# Patient Record
Sex: Female | Born: 1960 | Race: White | Hispanic: No | Marital: Married | Smoking: Never smoker
Health system: Southern US, Community
[De-identification: ages and names within clinical notes are randomized; demographics above are authoritative.]

## PROBLEM LIST (undated history)

## (undated) DIAGNOSIS — I10 Essential (primary) hypertension: Secondary | ICD-10-CM

## (undated) DIAGNOSIS — G4700 Insomnia, unspecified: Secondary | ICD-10-CM

## (undated) DIAGNOSIS — K219 Gastro-esophageal reflux disease without esophagitis: Secondary | ICD-10-CM

## (undated) HISTORY — DX: Gastro-esophageal reflux disease without esophagitis: K21.9

## (undated) HISTORY — PX: BREAST ENHANCEMENT SURGERY: SHX7

## (undated) HISTORY — PX: ESOPHAGOGASTRODUODENOSCOPY: SHX1529

## (undated) HISTORY — PX: COLONOSCOPY: SHX174

## (undated) HISTORY — DX: Essential (primary) hypertension: I10

## (undated) HISTORY — PX: ABDOMINAL HYSTERECTOMY: SHX81

## (undated) HISTORY — DX: Insomnia, unspecified: G47.00

---

## 2004-04-25 ENCOUNTER — Ambulatory Visit: Payer: Self-pay | Admitting: Internal Medicine

## 2005-04-02 ENCOUNTER — Ambulatory Visit: Payer: Self-pay | Admitting: Orthopedic Surgery

## 2005-04-04 ENCOUNTER — Ambulatory Visit (HOSPITAL_COMMUNITY): Admission: RE | Admit: 2005-04-04 | Discharge: 2005-04-04 | Payer: Self-pay | Admitting: Gynecology

## 2005-04-14 ENCOUNTER — Ambulatory Visit: Payer: Self-pay | Admitting: Orthopedic Surgery

## 2006-11-16 ENCOUNTER — Ambulatory Visit: Payer: Self-pay | Admitting: Unknown Physician Specialty

## 2006-11-19 ENCOUNTER — Ambulatory Visit: Payer: Self-pay | Admitting: Unknown Physician Specialty

## 2007-01-28 ENCOUNTER — Inpatient Hospital Stay: Payer: Self-pay | Admitting: Unknown Physician Specialty

## 2007-06-01 ENCOUNTER — Ambulatory Visit: Payer: Self-pay | Admitting: Unknown Physician Specialty

## 2007-08-30 ENCOUNTER — Emergency Department (HOSPITAL_COMMUNITY): Admission: EM | Admit: 2007-08-30 | Discharge: 2007-08-30 | Payer: Self-pay | Admitting: Emergency Medicine

## 2008-06-13 ENCOUNTER — Ambulatory Visit: Payer: Self-pay | Admitting: Unknown Physician Specialty

## 2009-08-15 ENCOUNTER — Ambulatory Visit: Payer: Self-pay | Admitting: Unknown Physician Specialty

## 2009-11-16 ENCOUNTER — Ambulatory Visit: Payer: Self-pay | Admitting: Rheumatology

## 2011-02-21 ENCOUNTER — Ambulatory Visit: Payer: Self-pay | Admitting: Internal Medicine

## 2014-02-10 ENCOUNTER — Ambulatory Visit: Payer: Self-pay | Admitting: Gastroenterology

## 2014-02-24 ENCOUNTER — Ambulatory Visit: Payer: Self-pay | Admitting: Gastroenterology

## 2014-05-26 ENCOUNTER — Ambulatory Visit: Payer: Self-pay | Admitting: Gastroenterology

## 2014-07-01 ENCOUNTER — Encounter (HOSPITAL_COMMUNITY): Payer: Self-pay

## 2014-07-01 ENCOUNTER — Emergency Department (HOSPITAL_COMMUNITY): Payer: BC Managed Care – PPO

## 2014-07-01 ENCOUNTER — Emergency Department (HOSPITAL_COMMUNITY)
Admission: EM | Admit: 2014-07-01 | Discharge: 2014-07-01 | Disposition: A | Discharge status: Home / Self Care | Payer: BC Managed Care – PPO | Attending: Emergency Medicine | Admitting: Emergency Medicine

## 2014-07-01 DIAGNOSIS — Z90710 Acquired absence of both cervix and uterus: Secondary | ICD-10-CM | POA: Diagnosis not present

## 2014-07-01 DIAGNOSIS — R52 Pain, unspecified: Secondary | ICD-10-CM

## 2014-07-01 DIAGNOSIS — Z79899 Other long term (current) drug therapy: Secondary | ICD-10-CM | POA: Diagnosis not present

## 2014-07-01 DIAGNOSIS — R1031 Right lower quadrant pain: Secondary | ICD-10-CM | POA: Insufficient documentation

## 2014-07-01 LAB — CBC WITH DIFFERENTIAL/PLATELET
Basophils Absolute: 0 10*3/uL (ref 0.0–0.1)
Basophils Relative: 0 % (ref 0–1)
Eosinophils Absolute: 0.1 10*3/uL (ref 0.0–0.7)
Eosinophils Relative: 1 % (ref 0–5)
HCT: 41.9 % (ref 36.0–46.0)
Hemoglobin: 13.8 g/dL (ref 12.0–15.0)
Lymphocytes Relative: 30 % (ref 12–46)
Lymphs Abs: 2.5 10*3/uL (ref 0.7–4.0)
MCH: 28.9 pg (ref 26.0–34.0)
MCHC: 32.9 g/dL (ref 30.0–36.0)
MCV: 87.7 fL (ref 78.0–100.0)
Monocytes Absolute: 0.4 10*3/uL (ref 0.1–1.0)
Monocytes Relative: 5 % (ref 3–12)
NEUTROS PCT: 64 % (ref 43–77)
Neutro Abs: 5.2 10*3/uL (ref 1.7–7.7)
Platelets: 331 10*3/uL (ref 150–400)
RBC: 4.78 MIL/uL (ref 3.87–5.11)
RDW: 12.2 % (ref 11.5–15.5)
WBC: 8.2 10*3/uL (ref 4.0–10.5)

## 2014-07-01 LAB — HEPATIC FUNCTION PANEL
ALBUMIN: 4.3 g/dL (ref 3.5–5.2)
ALT: 58 U/L — ABNORMAL HIGH (ref 0–35)
AST: 44 U/L — ABNORMAL HIGH (ref 0–37)
Alkaline Phosphatase: 81 U/L (ref 39–117)
BILIRUBIN TOTAL: 0.3 mg/dL (ref 0.3–1.2)
Bilirubin, Direct: <0.1 mg/dL (ref 0.0–0.5)
Total Protein: 8.3 g/dL (ref 6.0–8.3)

## 2014-07-01 LAB — BASIC METABOLIC PANEL
Anion gap: 5 (ref 5–15)
BUN: 12 mg/dL (ref 6–23)
CALCIUM: 9.7 mg/dL (ref 8.4–10.5)
CO2: 31 mmol/L (ref 19–32)
Chloride: 104 mmol/L (ref 96–112)
Creatinine, Ser: 0.72 mg/dL (ref 0.50–1.10)
GFR calc Af Amer: >90 mL/min (ref >90)
GFR calc non Af Amer: >90 mL/min (ref >90)
Glucose, Bld: 87 mg/dL (ref 70–99)
Potassium: 3.7 mmol/L (ref 3.5–5.1)
Sodium: 140 mmol/L (ref 135–145)

## 2014-07-01 LAB — URINALYSIS, ROUTINE W REFLEX MICROSCOPIC
Bilirubin Urine: NEGATIVE
Glucose, UA: NEGATIVE mg/dL
Hgb urine dipstick: NEGATIVE
Ketones, ur: NEGATIVE mg/dL
Leukocytes, UA: NEGATIVE
Nitrite: NEGATIVE
Protein, ur: NEGATIVE mg/dL
Specific Gravity, Urine: 1.02 (ref 1.005–1.030)
Urobilinogen, UA: 0.2 mg/dL (ref 0.0–1.0)
pH: 6 (ref 5.0–8.0)

## 2014-07-01 MED ORDER — IOHEXOL 300 MG/ML  SOLN
50.0000 mL | Freq: Once | INTRAMUSCULAR | Status: AC | PRN
  Administered 2014-07-01: 50 mL via ORAL

## 2014-07-01 MED ORDER — IOHEXOL 300 MG/ML  SOLN
100.0000 mL | Freq: Once | INTRAMUSCULAR | Status: AC | PRN
  Administered 2014-07-01: 100 mL via INTRAVENOUS

## 2014-07-01 MED ORDER — OXYCODONE-ACETAMINOPHEN 5-325 MG PO TABS: 1.0000 | ORAL_TABLET | ORAL | Status: DC | PRN

## 2014-07-01 MED ORDER — ONDANSETRON HCL 4 MG PO TABS: 4.0000 mg | ORAL_TABLET | Freq: Four times a day (QID) | ORAL | Status: DC

## 2014-07-01 MED ORDER — DICYCLOMINE HCL 20 MG PO TABS: 20.0000 mg | ORAL_TABLET | Freq: Two times a day (BID) | ORAL | Status: DC

## 2014-07-01 MED ORDER — ONDANSETRON HCL 4 MG/2ML IJ SOLN
4.0000 mg | Freq: Once | INTRAMUSCULAR | Status: AC
  Administered 2014-07-01: 4 mg via INTRAVENOUS
  Filled 2014-07-01: qty 2

## 2014-07-01 MED ORDER — HYDROMORPHONE HCL 1 MG/ML IJ SOLN
1.0000 mg | Freq: Once | INTRAMUSCULAR | Status: AC
  Administered 2014-07-01: 1 mg via INTRAVENOUS
  Filled 2014-07-01: qty 1

## 2014-07-01 NOTE — ED Provider Notes (Signed)
CSN: [id number]     Arrival date & time 07/01/14  1336 History   First MD Initiated Contact with Patient 07/01/14 1408     Chief Complaint  Patient presents with  . Abdominal Pain     (Consider location/radiation/quality/duration/timing/severity/associated sxs/prior Treatment) Patient is a 54 y.o. female presenting with abdominal pain. The history is provided by the patient (the pt complains of rlq pain for 2 weeks.  worse in 2 days).  Abdominal Pain Pain location:  RLQ Pain quality: aching   Pain radiates to:  Does not radiate Pain severity:  Moderate Onset quality:  Sudden Timing:  Constant Progression:  Waxing and waning Chronicity:  New Context: not alcohol use   Associated symptoms: no chest pain, no cough, no diarrhea, no fatigue and no hematuria     History reviewed. No pertinent past medical history. Past Surgical History  Procedure Laterality Date  . Abdominal hysterectomy    . Esophagogastroduodenoscopy    . Colonoscopy     No family history on file. History  Substance Use Topics  . Smoking status: Never Smoker   . Smokeless tobacco: Not on file  . Alcohol Use: No   OB History    No data available     Review of Systems  Constitutional: Negative for appetite change and fatigue.  HENT: Negative for congestion, ear discharge and sinus pressure.   Eyes: Negative for discharge.  Respiratory: Negative for cough.   Cardiovascular: Negative for chest pain.  Gastrointestinal: Positive for abdominal pain. Negative for diarrhea.  Genitourinary: Negative for frequency and hematuria.  Musculoskeletal: Negative for back pain.  Skin: Negative for rash.  Neurological: Negative for seizures and headaches.  Psychiatric/Behavioral: Negative for hallucinations.      Allergies  Tetracyclines & related  Home Medications   Prior to Admission medications   Medication Sig Start Date End Date Taking? Authorizing Provider  fluticasone (FLONASE) 50 MCG/ACT nasal spray  Place 1 spray into both nostrils daily as needed for allergies.  05/10/14  Yes Historical Provider, MD  ibuprofen (ADVIL,MOTRIN) 200 MG tablet Take 600 mg by mouth daily as needed for moderate pain.   Yes Historical Provider, MD  pantoprazole (PROTONIX) 20 MG tablet Take 20 mg by mouth 2 (two) times daily.   Yes Historical Provider, MD  traMADol (ULTRAM) 50 MG tablet Take 1 tablet by mouth daily as needed for moderate pain.  06/03/14  Yes Historical Provider, MD  Vitamin D, Ergocalciferol, (DRISDOL) 50000 UNITS CAPS capsule Take 1 capsule by mouth once a week. Take on Sunday. 05/23/14  Yes Historical Provider, MD  zolpidem (AMBIEN) 5 MG tablet Take 2.5 mg by mouth at bedtime as needed for sleep.   Yes Historical Provider, MD   BP 154/105 mmHg  Pulse 79  Temp(Src) 98.5 F (36.9 C) (Oral)  Resp 20  Ht  (1.6 m)  Wt 165 lb (74.844 kg)  BMI 29.24 kg/m2  SpO2 100% Physical Exam  Constitutional: She is oriented to person, place, and time. She appears well-developed.  HENT:  Head: Normocephalic.  Eyes: Conjunctivae and EOM are normal. No scleral icterus.  Neck: Neck supple. No thyromegaly present.  Cardiovascular: Normal rate and regular rhythm.  Exam reveals no gallop and no friction rub.   No murmur heard. Pulmonary/Chest: No stridor. She has no wheezes. She has no rales. She exhibits no tenderness.  Abdominal: She exhibits no distension. There is tenderness. There is no rebound.  Tender rlq  Musculoskeletal: Normal range of motion. She exhibits no  edema.  Lymphadenopathy:    She has no cervical adenopathy.  Neurological: She is oriented to person, place, and time. She exhibits normal muscle tone. Coordination normal.  Skin: No rash noted. No erythema.  Psychiatric: She has a normal mood and affect. Her behavior is normal.    ED Course  Procedures (including critical care time) Labs Review Labs Reviewed  URINALYSIS, ROUTINE W REFLEX MICROSCOPIC - Abnormal; Notable for the  following:    APPearance HAZY (*)    All other components within normal limits  HEPATIC FUNCTION PANEL - Abnormal; Notable for the following:    AST 44 (*)    ALT 58 (*)    All other components within normal limits  CBC WITH DIFFERENTIAL/PLATELET  BASIC METABOLIC PANEL    Imaging Review No results found.   EKG Interpretation None      MDM   Final diagnoses:  Pain        Benny LennertJoseph L Izayah Miner, MD 07/03/14 2358

## 2014-07-01 NOTE — ED Notes (Signed)
Pt c/o pain in RLQ x 2 weeks, worse the past 2 days.  Denies any vomiting, diarrhea, or difficulty urinating.  Denies fever.  Denies abnormal vaginal bleeding or discharge.  LBM was today.

## 2014-07-01 NOTE — Discharge Instructions (Signed)

## 2014-07-01 NOTE — ED Provider Notes (Signed)
Patient signed out to me to follow-up on CT scan. Patient has been experiencing intermittent episodes of right lower quadrant and inguinal area pain for 2 weeks. In the last 2 days, pain has become more continuous, now constant. It has become severe as well. She has not had any other concurrent symptoms. Patient has had total hysterectomy.  CT scan has been reviewed, no cause is identified. She has a normal appendix. No evidence of inflammatory process, diverticulitis, colitis, etc. Incidental findings noted, of pain identified, will treat with analgesia and is to follow-[place].  Results for orders placed or performed during the hospital encounter of 07/01/14  CBC with Differential  Result Value Ref Range   WBC 8.2 4.0 - 10.5 K/uL   RBC 4.78 3.87 - 5.11 MIL/uL   Hemoglobin 13.8 12.0 - 15.0 g/dL   HCT 16.141.9 09.636.0 - 04.546.0 %   MCV 87.7 78.0 - 100.0 fL   MCH 28.9 26.0 - 34.0 pg   MCHC 32.9 30.0 - 36.0 g/dL   RDW 40.912.2 81.111.5 - 91.415.5 %   Platelets 331 150 - 400 K/uL   Neutrophils Relative % 64 43 - 77 %   Neutro Abs 5.2 1.7 - 7.7 K/uL   Lymphocytes Relative 30 12 - 46 %   Lymphs Abs 2.5 0.7 - 4.0 K/uL   Monocytes Relative 5 3 - 12 %   Monocytes Absolute 0.4 0.1 - 1.0 K/uL   Eosinophils Relative 1 0 - 5 %   Eosinophils Absolute 0.1 0.0 - 0.7 K/uL   Basophils Relative 0 0 - 1 %   Basophils Absolute 0.0 0.0 - 0.1 K/uL  Basic metabolic panel  Result Value Ref Range   Sodium 140 135 - 145 mmol/L   Potassium 3.7 3.5 - 5.1 mmol/L   Chloride 104 96 - 112 mmol/L   CO2 31 19 - 32 mmol/L   Glucose, Bld 87 70 - 99 mg/dL   BUN 12 6 - 23 mg/dL   Creatinine, Ser 7.820.72 0.50 - 1.10 mg/dL   Calcium 9.7 8.4 - 95.610.5 mg/dL   GFR calc non Af Amer >90 >90 mL/min   GFR calc Af Amer >90 >90 mL/min   Anion gap 5 5 - 15  Urinalysis, Routine w reflex microscopic  Result Value Ref Range   Color, Urine YELLOW YELLOW   APPearance HAZY (A) CLEAR   Specific Gravity, Urine 1.020  1.005 - 1.030   pH 6.0 5.0 - 8.0   Glucose, UA NEGATIVE NEGATIVE mg/dL   Hgb urine dipstick NEGATIVE NEGATIVE   Bilirubin Urine NEGATIVE NEGATIVE   Ketones, ur NEGATIVE NEGATIVE mg/dL   Protein, ur NEGATIVE NEGATIVE mg/dL   Urobilinogen, UA 0.2 0.0 - 1.0 mg/dL   Nitrite NEGATIVE NEGATIVE   Leukocytes, UA NEGATIVE NEGATIVE  Hepatic function panel  Result Value Ref Range   Total Protein 8.3 6.0 - 8.3 g/dL   Albumin 4.3 3.5 - 5.2 g/dL   AST 44 (H) 0 - 37 U/L   ALT 58 (H) 0 - 35 U/L   Alkaline Phosphatase 81 39 - 117 U/L   Total Bilirubin 0.3 0.3 - 1.2 mg/dL   Bilirubin, Direct <2.1<0.1 0.0 - 0.5 mg/dL   Indirect Bilirubin NOT CALCULATED 0.3 - 0.9 mg/dL   Ct Abdomen Pelvis W Contrast  07/01/2014   CLINICAL DATA:  Pt c/o pain in RLQ x 2 weeks, worse the past 2 days. Denies any vomiting, diarrhea, or difficulty urinating. Denies fever. Hx of  hysterectomy, EGD  EXAM: CT ABDOMEN AND PELVIS WITH CONTRAST  TECHNIQUE: Multidetector CT imaging of the abdomen and pelvis was performed using the standard protocol following bolus administration of intravenous contrast.  CONTRAST:  OMNIPAQUE IOHEXOL 300 MG/ML SOLN, 50mL OMNIPAQUE IOHEXOL 300 MG/ML SOLN  COMPARISON:  None  FINDINGS: Clear lung bases.  Heart normal in size.  Fatty infiltration of the liver. There multiple low-density liver lesions. Largest lies in the posterior right lobe measuring 2.4 cm. These are nonenhancing consistent with cysts. There is more focal hypoattenuation in the central liver consistent with focal fat. No other liver lesions.  Spleen and pancreas are normal. Gallbladder surgically absent. No bile duct dilation. Normal adrenal glands, kidneys, ureters and bladder.  No pathologically enlarged lymph nodes. No abnormal fluid collections.  Uterus surgically absent.  No pelvic masses.  Colon and small bowel are unremarkable.  Normal appendix.  No significant bony abnormality.  IMPRESSION: 1. No acute findings.  Normal appendix  visualized. 2. Diffuse hepatic steatosis. 3. Multiple small low-density liver lesions, likely all cysts. 4. Status post cholecystectomy and hysterectomy.   Electronically Signed   By: Amie Portland M.D.   On: 07/01/2014 16:36      Gilda Crease, MD 07/01/14 [phone]

## 2014-07-12 ENCOUNTER — Ambulatory Visit: Payer: Self-pay | Admitting: Internal Medicine

## 2014-10-02 LAB — SURGICAL PATHOLOGY

## 2016-04-29 ENCOUNTER — Other Ambulatory Visit: Payer: Self-pay | Admitting: Obstetrics and Gynecology

## 2016-04-29 DIAGNOSIS — Z1231 Encounter for screening mammogram for malignant neoplasm of breast: Secondary | ICD-10-CM

## 2016-05-31 IMAGING — CT CT ABD-PELV W/ CM
2 of 5 series · 16 of 46 positions shown, 18 images · IV contrast (omnipaque)
Comparison: None

CLINICAL DATA: Pt c/o pain in RLQ x 2 weeks, worse the past 2 days.
Denies any vomiting, diarrhea, or difficulty urinating. Denies
fever. Hx of hysterectomy, EGD

EXAM:
CT ABDOMEN AND PELVIS WITH CONTRAST
TECHNIQUE: Multidetector CT imaging of the abdomen and pelvis was performed
using the standard protocol following bolus administration of
intravenous contrast.
CONTRAST:  100mL OMNIPAQUE IOHEXOL 300 MG/ML SOLN, 50mL OMNIPAQUE
IOHEXOL 300 MG/ML SOLN

[Series 2: abd_pel_with 5.0 b40f · axial · 0.73mm/px · z∈[-489,-74]mm · 13 of 95 slices shown, 15 images]
[im 6/95  soft-tissue]
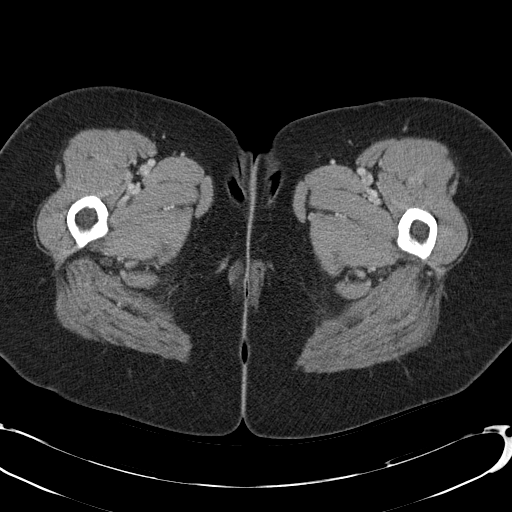
[im 6/95  bone]
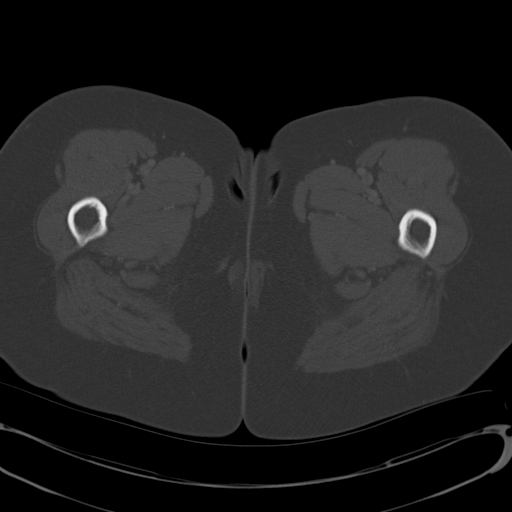
[im 11/95  soft-tissue]
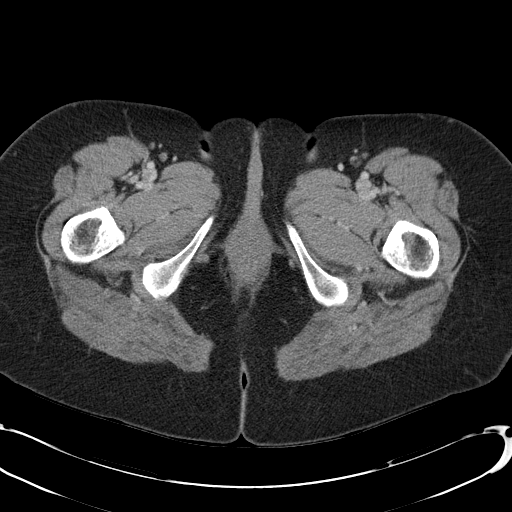
[im 21/95  soft-tissue]
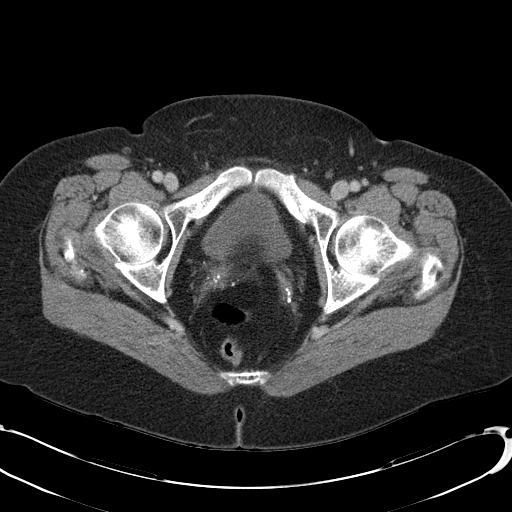
[im 27/95  soft-tissue]
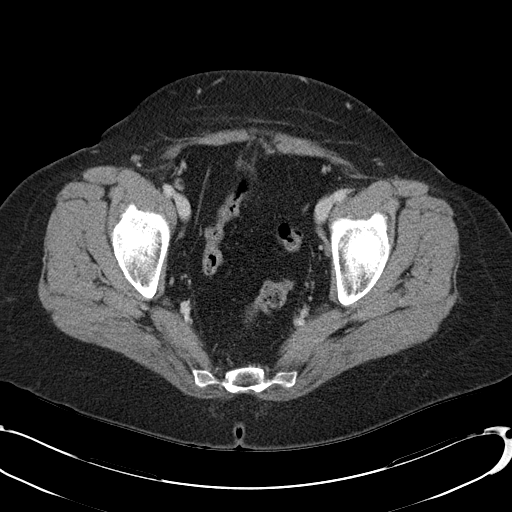
[im 32/95  soft-tissue]
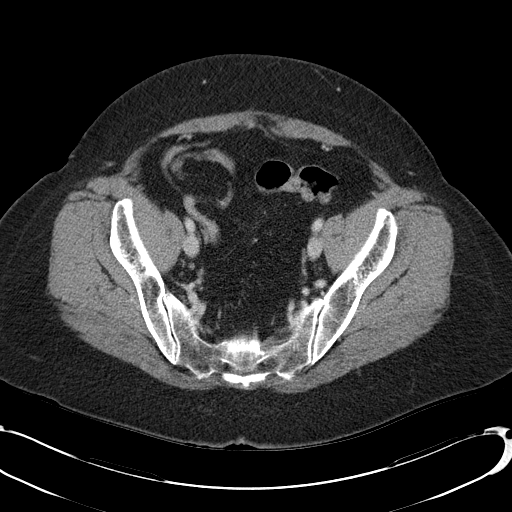
[im 42/95  soft-tissue]
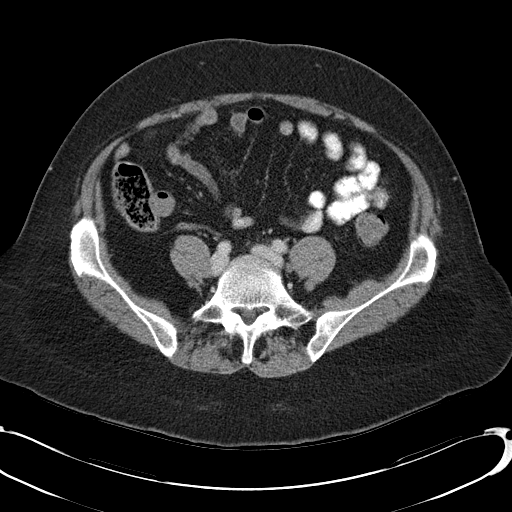
[im 48/95  soft-tissue]
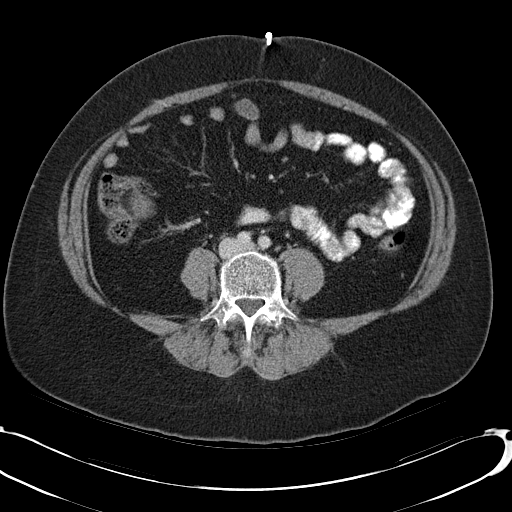
[im 53/95  soft-tissue]
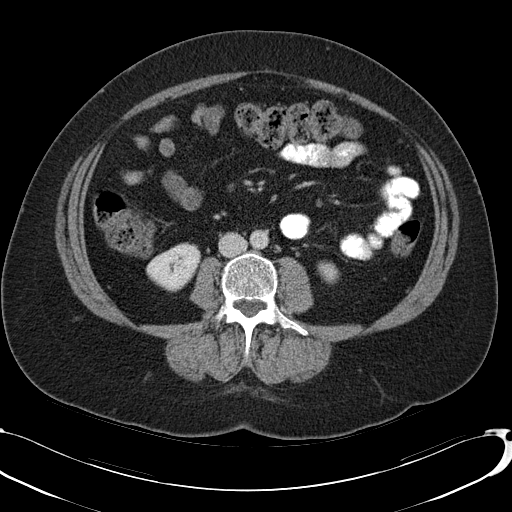
[im 63/95  soft-tissue]
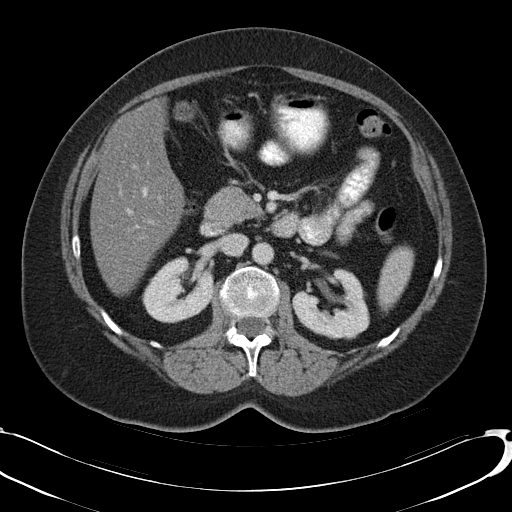
[im 63/95  bone]
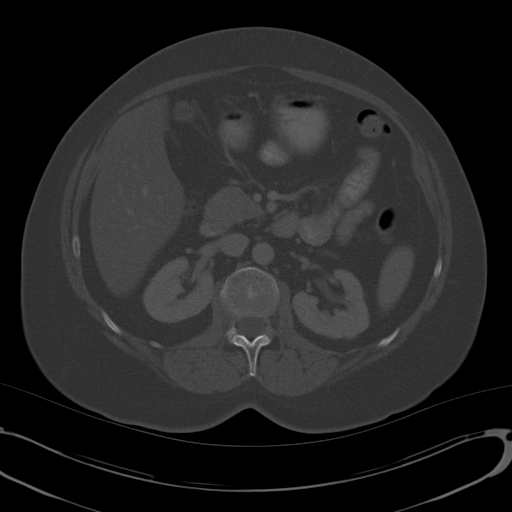
[im 68/95  soft-tissue]
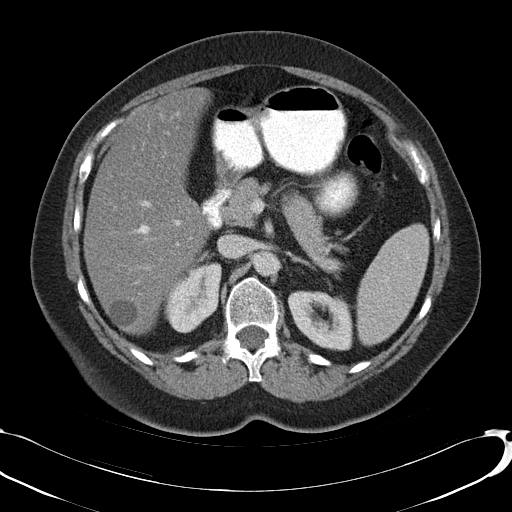
[im 74/95  soft-tissue]
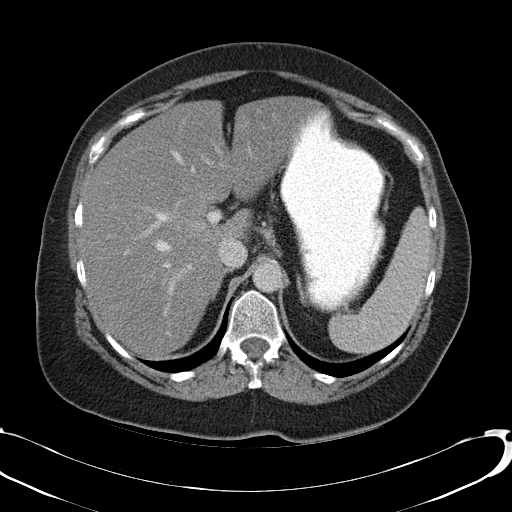
[im 84/95  soft-tissue]
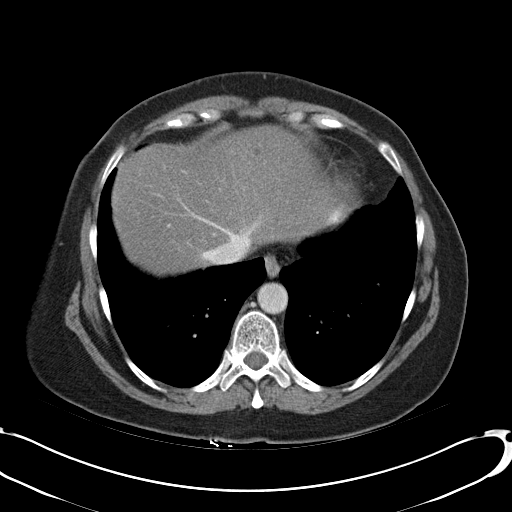
[im 89/95  soft-tissue]
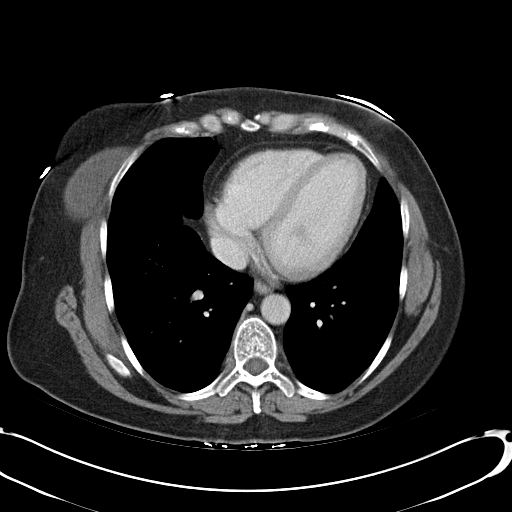

[Series 3: abd_pel_with 3.0 spo cor · coronal · 0.70mm/px · 3 of 97 slices shown]
[im 33/97  soft-tissue]
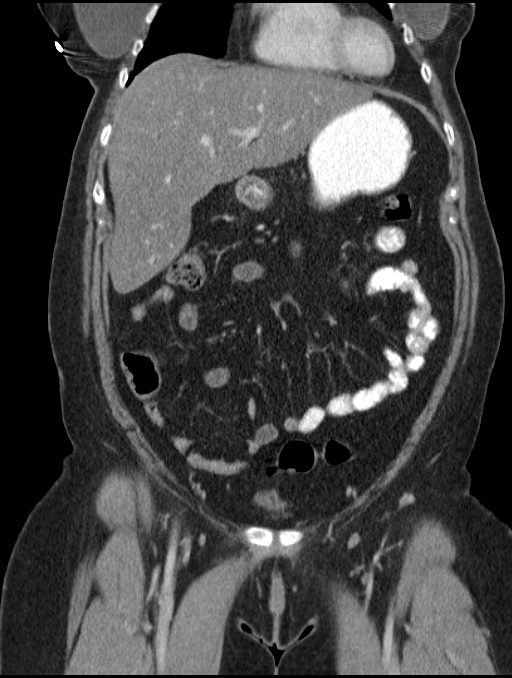
[im 43/97  soft-tissue]
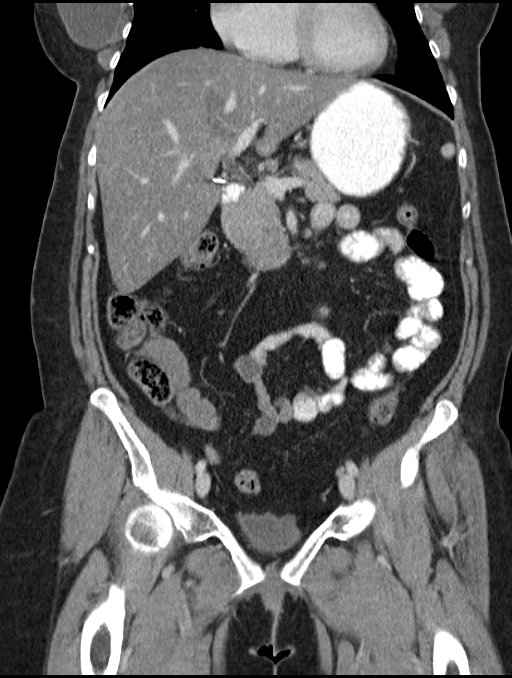
[im 54/97  soft-tissue]
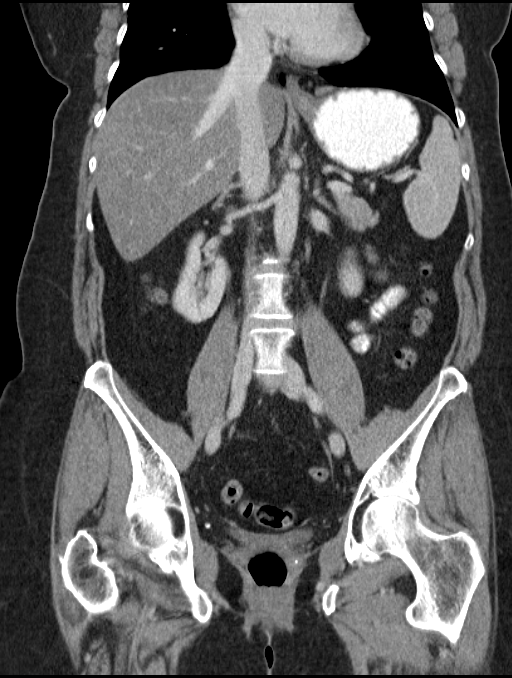

[16 of 46 positions shown; findings below may reference images not displayed]

FINDINGS: Clear lung bases.  Heart normal in size.

Fatty infiltration of the liver. There multiple low-density liver
lesions. Largest lies in the posterior right lobe measuring 2.4 cm.
These are nonenhancing consistent with cysts. There is more focal
hypoattenuation in the central liver consistent with focal fat. No
other liver lesions.

Spleen and pancreas are normal. Gallbladder surgically absent. No
bile duct dilation. Normal adrenal glands, kidneys, ureters and
bladder.

No pathologically enlarged lymph nodes. No abnormal fluid
collections.

Uterus surgically absent.  No pelvic masses.

Colon and small bowel are unremarkable.  Normal appendix.

No significant bony abnormality.
IMPRESSION: 1. No acute findings.  Normal appendix visualized.
2. Diffuse hepatic steatosis.
3. Multiple small low-density liver lesions, likely all cysts.
4. Status post cholecystectomy and hysterectomy.

## 2016-06-11 ENCOUNTER — Ambulatory Visit: Payer: BC Managed Care – PPO | Attending: Obstetrics and Gynecology

## 2016-11-13 DIAGNOSIS — I10 Essential (primary) hypertension: Secondary | ICD-10-CM | POA: Insufficient documentation

## 2017-06-16 ENCOUNTER — Other Ambulatory Visit: Payer: Self-pay | Admitting: Obstetrics and Gynecology

## 2017-06-16 DIAGNOSIS — Z1231 Encounter for screening mammogram for malignant neoplasm of breast: Secondary | ICD-10-CM

## 2017-12-02 DIAGNOSIS — Z91030 Bee allergy status: Secondary | ICD-10-CM | POA: Insufficient documentation

## 2018-03-11 DIAGNOSIS — I83893 Varicose veins of bilateral lower extremities with other complications: Secondary | ICD-10-CM | POA: Insufficient documentation

## 2019-03-18 DIAGNOSIS — E559 Vitamin D deficiency, unspecified: Secondary | ICD-10-CM | POA: Insufficient documentation

## 2019-03-23 ENCOUNTER — Other Ambulatory Visit: Payer: Self-pay | Admitting: Internal Medicine

## 2019-03-25 ENCOUNTER — Other Ambulatory Visit (HOSPITAL_COMMUNITY): Payer: Self-pay | Admitting: Internal Medicine

## 2019-03-25 ENCOUNTER — Other Ambulatory Visit: Payer: Self-pay | Admitting: Internal Medicine

## 2019-03-25 DIAGNOSIS — R7989 Other specified abnormal findings of blood chemistry: Secondary | ICD-10-CM

## 2019-06-14 ENCOUNTER — Ambulatory Visit: Payer: BC Managed Care – PPO

## 2019-07-07 ENCOUNTER — Ambulatory Visit: Payer: BC Managed Care – PPO

## 2019-07-12 ENCOUNTER — Ambulatory Visit: Payer: BC Managed Care – PPO

## 2019-07-18 ENCOUNTER — Ambulatory Visit: Payer: BC Managed Care – PPO | Attending: Internal Medicine

## 2019-07-18 ENCOUNTER — Other Ambulatory Visit: Payer: Self-pay

## 2019-07-18 DIAGNOSIS — Z20822 Contact with and (suspected) exposure to covid-19: Secondary | ICD-10-CM

## 2019-07-19 LAB — NOVEL CORONAVIRUS, NAA: SARS-CoV-2, NAA: NOT DETECTED

## 2019-07-20 ENCOUNTER — Ambulatory Visit: Payer: BC Managed Care – PPO

## 2019-07-26 ENCOUNTER — Ambulatory Visit: Payer: BC Managed Care – PPO | Attending: Internal Medicine

## 2019-10-27 ENCOUNTER — Other Ambulatory Visit (HOSPITAL_COMMUNITY): Payer: Self-pay | Admitting: Family Medicine

## 2019-10-27 ENCOUNTER — Other Ambulatory Visit: Payer: Self-pay

## 2019-10-27 ENCOUNTER — Ambulatory Visit
Admission: RE | Admit: 2019-10-27 | Discharge: 2019-10-27 | Disposition: A | Discharge status: Home / Self Care | Payer: BC Managed Care – PPO | Source: Ambulatory Visit | Attending: Family Medicine | Admitting: Family Medicine

## 2019-10-27 ENCOUNTER — Other Ambulatory Visit: Payer: Self-pay | Admitting: Family Medicine

## 2019-10-27 DIAGNOSIS — M79604 Pain in right leg: Secondary | ICD-10-CM

## 2019-10-27 DIAGNOSIS — R252 Cramp and spasm: Secondary | ICD-10-CM

## 2019-11-28 ENCOUNTER — Other Ambulatory Visit (INDEPENDENT_AMBULATORY_CARE_PROVIDER_SITE_OTHER): Payer: Self-pay | Admitting: Nurse Practitioner

## 2019-11-28 ENCOUNTER — Ambulatory Visit (INDEPENDENT_AMBULATORY_CARE_PROVIDER_SITE_OTHER): Payer: BC Managed Care – PPO | Admitting: Nurse Practitioner

## 2019-11-28 ENCOUNTER — Other Ambulatory Visit: Payer: Self-pay

## 2019-11-28 ENCOUNTER — Ambulatory Visit (INDEPENDENT_AMBULATORY_CARE_PROVIDER_SITE_OTHER): Payer: BC Managed Care – PPO

## 2019-11-28 VITALS — BP 128/76 | HR 70 | Ht 64.0 in | Wt 168.0 lb

## 2019-11-28 DIAGNOSIS — I10 Essential (primary) hypertension: Secondary | ICD-10-CM

## 2019-11-28 DIAGNOSIS — I83819 Varicose veins of unspecified lower extremities with pain: Secondary | ICD-10-CM

## 2019-11-28 DIAGNOSIS — I83893 Varicose veins of bilateral lower extremities with other complications: Secondary | ICD-10-CM | POA: Diagnosis not present

## 2019-11-28 DIAGNOSIS — M810 Age-related osteoporosis without current pathological fracture: Secondary | ICD-10-CM | POA: Insufficient documentation

## 2019-11-28 DIAGNOSIS — K219 Gastro-esophageal reflux disease without esophagitis: Secondary | ICD-10-CM | POA: Insufficient documentation

## 2019-11-28 DIAGNOSIS — N809 Endometriosis, unspecified: Secondary | ICD-10-CM | POA: Insufficient documentation

## 2019-12-04 ENCOUNTER — Encounter (INDEPENDENT_AMBULATORY_CARE_PROVIDER_SITE_OTHER): Payer: Self-pay | Admitting: Nurse Practitioner

## 2019-12-04 NOTE — Progress Notes (Signed)
Subjective:    Patient ID: Lisa Bryan, female    DOB: 1961/04/01, 59 y.o.   MRN: [id number] Chief Complaint  Patient presents with  . New Patient (Initial Visit)    Varicose veins w/pain     The patient presents today for evaluation by Dr. Hyacinth Meeker for pain varicosities in the bilateral lower extremities.  The patient notes that this pain has been ongoing for some time.  The patient notes that she has an aching from her knees down.  She notes that the ache is not constant however humidity tends to make it worse and ibuprofen helps the pain.  The patient notes that the achiness tends to get a little bit worse during the end of the day.  Patient also notes that it feels as if she has to wiggle her legs from time to time.  The patient notes that years ago she had a ruptured vein that seemed to happen spontaneously.  The patient has not utilized medical grade 1 compression stockings previously.  The patient denies any fever, chills, nausea  Today noninvasive study showed no evidence of DVT or superficial venous thrombosis bilaterally.  The right lower extremity has no evidence of chronic venous insufficiency within the deep venous system.  The superficial venous system of the right lower extremity has no evidence of reflux in the great or small saphenous vein.  The left lower extremity also has no evidence of deep venous insufficiency.  There is evidence of reflux in the great saphenous vein at the saphenofemoral junction.  There is no other area of reflux in the left lower extremity.  No evidence reflux seen in the small saphenous vein.   Review of Systems  Musculoskeletal: Positive for arthralgias.  Hematological: Bruises/bleeds easily.       Objective:   Physical Exam Vitals reviewed.  HENT:     Head: Normocephalic.  Cardiovascular:     Rate and Rhythm: Normal rate.     Pulses: Normal pulses.  Pulmonary:     Effort: Pulmonary effort is normal.  Musculoskeletal:        General: No  swelling or deformity.     Right lower leg: No edema.     Left lower leg: No edema.  Neurological:     Mental Status: She is alert and oriented to person, place, and time.  Psychiatric:        Mood and Affect: Mood normal.        Behavior: Behavior normal.        Thought Content: Thought content normal.        Judgment: Judgment normal.     BP 128/76   Pulse 70   Ht 5\' 4"  (1.626 m)   Wt 168 lb (76.2 kg)   BMI 28.84 kg/m   History reviewed. No pertinent past medical history.  Social History   Socioeconomic History  . Marital status: Married    Spouse name: Not on file  . Number of children: Not on file  . Years of education: Not on file  . Highest education level: Not on file  Occupational History  . Not on file  Tobacco Use  . Smoking status: Never Smoker  . Smokeless tobacco: Never Used  Substance and Sexual Activity  . Alcohol use: No  . Drug use: No  . Sexual activity: Not on file  Other Topics Concern  . Not on file  Social History Narrative  . Not on file   Social Determinants of Health  Financial Resource Strain:   . Difficulty of Paying Living Expenses:   Food Insecurity:   . Worried About Programme researcher, broadcasting/film/video in the Last Year:   . Barista in the Last Year:   Transportation Needs:   . Freight forwarder (Medical):   Marland Kitchen Lack of Transportation (Non-Medical):   Physical Activity:   . Days of Exercise per Week:   . Minutes of Exercise per Session:   Stress:   . Feeling of Stress :   Social Connections:   . Frequency of Communication with Friends and Family:   . Frequency of Social Gatherings with Friends and Family:   . Attends Religious Services:   . Active Member of Clubs or Organizations:   . Attends Banker Meetings:   Marland Kitchen Marital Status:   Intimate Partner Violence:   . Fear of Current or Ex-Partner:   . Emotionally Abused:   Marland Kitchen Physically Abused:   . Sexually Abused:     Past Surgical History:  Procedure  Laterality Date  . ABDOMINAL HYSTERECTOMY    . COLONOSCOPY    . ESOPHAGOGASTRODUODENOSCOPY      History reviewed. No pertinent family history.  Allergies  Allergen Reactions  . Other Swelling  . Tetracyclines & Related     swelling       Assessment & Plan:   1. 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  The patient has a small amount of reflux in her left lower extremity however the symptoms are consistent with both lower extremities.  Also the current symptoms typically do not occur with just 1 area of reflux.  Patient still advised to continue with conservative therapy of wearing medical grade 1 compression socks and elevation.  If her symptoms worsen or change patient is welcome to follow-up for reevaluation.  2. Benign essential HTN Continue antihypertensive medications as already ordered, these medications have been reviewed and there are no changes at this time.    Current Outpatient Medications on File Prior to Visit  Medication Sig Dispense Refill  . dicyclomine (BENTYL) 20 MG tablet Take 1 tablet (20 mg total) by mouth 2 (two) times daily. 20 tablet 0  . fluticasone (FLONASE) 50 MCG/ACT nasal spray Place 1 spray into both nostrils daily as needed for allergies.     . hydrochlorothiazide (HYDRODIURIL) 25 MG tablet Take by mouth.    Marland Kitchen ibuprofen (ADVIL,MOTRIN) 200 MG tablet Take 600 mg by mouth daily as needed for moderate pain.    Marland Kitchen olmesartan (BENICAR) 20 MG tablet Take 20 mg by mouth daily.    . ondansetron (ZOFRAN) 4 MG tablet Take 1 tablet (4 mg total) by mouth every 6 (six) hours. 20 tablet 0  . pantoprazole (PROTONIX) 20 MG tablet Take 20 mg by mouth 2 (two) times daily.    . traMADol (ULTRAM) 50 MG tablet Take 1 tablet by mouth daily as needed for moderate pain.     . verapamil (CALAN-SR) 120 MG CR tablet Take by mouth.     . Vitamin D, Ergocalciferol, (DRISDOL) 50000 UNITS CAPS capsule Take 1 capsule by mouth once a week. Take on Sunday.    . zolpidem (AMBIEN) 5 MG tablet Take 2.5 mg by mouth at bedtime as needed for sleep.    Marland Kitchen oxyCODONE-acetaminophen (PERCOCET) 5-325 MG per tablet Take 1 tablet by mouth every 4 (  four) hours as needed. (Patient not taking: Reported on 11/28/2019) 20 tablet 0   No current facility-administered medications on file prior to visit.    There are no Patient Instructions on file for this visit. No follow-ups on file.   Kris Hartmann, NP

## 2021-10-17 ENCOUNTER — Other Ambulatory Visit: Payer: Self-pay | Admitting: Physician Assistant

## 2021-10-17 ENCOUNTER — Other Ambulatory Visit: Payer: Self-pay

## 2021-10-17 ENCOUNTER — Ambulatory Visit
Admission: RE | Admit: 2021-10-17 | Discharge: 2021-10-17 | Disposition: A | Discharge status: Home / Self Care | Payer: BC Managed Care – PPO | Source: Ambulatory Visit | Attending: Physician Assistant | Admitting: Physician Assistant

## 2021-10-17 DIAGNOSIS — R600 Localized edema: Secondary | ICD-10-CM | POA: Insufficient documentation

## 2022-02-10 ENCOUNTER — Emergency Department (HOSPITAL_COMMUNITY): Payer: BC Managed Care – PPO

## 2022-02-10 ENCOUNTER — Other Ambulatory Visit: Payer: Self-pay

## 2022-02-10 ENCOUNTER — Emergency Department (HOSPITAL_COMMUNITY)
Admission: EM | Admit: 2022-02-10 | Discharge: 2022-02-10 | Disposition: A | Discharge status: Home / Self Care | Payer: BC Managed Care – PPO | Attending: Emergency Medicine | Admitting: Emergency Medicine

## 2022-02-10 ENCOUNTER — Encounter (HOSPITAL_COMMUNITY): Payer: Self-pay | Admitting: Emergency Medicine

## 2022-02-10 DIAGNOSIS — W230XXA Caught, crushed, jammed, or pinched between moving objects, initial encounter: Secondary | ICD-10-CM | POA: Diagnosis not present

## 2022-02-10 DIAGNOSIS — I10 Essential (primary) hypertension: Secondary | ICD-10-CM | POA: Diagnosis not present

## 2022-02-10 DIAGNOSIS — S61012A Laceration without foreign body of left thumb without damage to nail, initial encounter: Secondary | ICD-10-CM

## 2022-02-10 DIAGNOSIS — Z79899 Other long term (current) drug therapy: Secondary | ICD-10-CM | POA: Insufficient documentation

## 2022-02-10 DIAGNOSIS — S62525A Nondisplaced fracture of distal phalanx of left thumb, initial encounter for closed fracture: Secondary | ICD-10-CM | POA: Insufficient documentation

## 2022-02-10 DIAGNOSIS — Z23 Encounter for immunization: Secondary | ICD-10-CM | POA: Insufficient documentation

## 2022-02-10 DIAGNOSIS — S6992XA Unspecified injury of left wrist, hand and finger(s), initial encounter: Secondary | ICD-10-CM | POA: Diagnosis present

## 2022-02-10 HISTORY — DX: Essential (primary) hypertension: I10

## 2022-02-10 HISTORY — DX: Gastro-esophageal reflux disease without esophagitis: K21.9

## 2022-02-10 HISTORY — DX: Insomnia, unspecified: G47.00

## 2022-02-10 MED ORDER — NAPROXEN 250 MG PO TABS
500.0000 mg | ORAL_TABLET | Freq: Once | ORAL | Status: AC
  Administered 2022-02-10: 500 mg via ORAL
  Filled 2022-02-10: qty 2

## 2022-02-10 MED ORDER — TETANUS-DIPHTH-ACELL PERTUSSIS 5-2.5-18.5 LF-MCG/0.5 IM SUSY
0.5000 mL | PREFILLED_SYRINGE | Freq: Once | INTRAMUSCULAR | Status: AC
  Administered 2022-02-10: 0.5 mL via INTRAMUSCULAR
  Filled 2022-02-10: qty 0.5

## 2022-02-10 MED ORDER — NAPROXEN 500 MG PO TABS: 500.0000 mg | ORAL_TABLET | Freq: Two times a day (BID) | ORAL | 0 refills | Status: DC

## 2022-02-10 MED ORDER — BACITRACIN ZINC 500 UNIT/GM EX OINT
TOPICAL_OINTMENT | Freq: Every day | CUTANEOUS | Status: DC
  Filled 2022-02-10: qty 0.9
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
## 2022-02-10 NOTE — Discharge Instructions (Addendum)
X-ray imaging today showed evidence of a nondisplaced fracture in the tip of the thumb.  This has been immobilized with a splint.  He may also utilize the sling as needed for additional support.  You have been provided the contact information for an orthopedic specialist by the name of Dr. Yehuda Budd.  Please call tomorrow morning to schedule a follow-up appointment within the next 3 to 5 days for reevaluation continue medical management.  You have been prescribed an anti-inflammatory by the name of naproxen.  You may take 1 tablet every 12 hours as needed for pain relief.  Always take with plenty of food and water.  Do not take ibuprofen or Motrin on top of this, stay on same medication family.  Though you may take Tylenol in addition to the naproxen.  Return to the ED for any new or worsening symptoms as discussed.

## 2022-02-10 NOTE — ED Notes (Signed)
Thumb spica splint applied at this time.

## 2022-04-07 ENCOUNTER — Encounter (INDEPENDENT_AMBULATORY_CARE_PROVIDER_SITE_OTHER): Payer: Self-pay

## 2023-08-07 ENCOUNTER — Other Ambulatory Visit: Payer: Self-pay | Admitting: Internal Medicine

## 2023-08-07 DIAGNOSIS — Z1231 Encounter for screening mammogram for malignant neoplasm of breast: Secondary | ICD-10-CM

## 2023-08-17 ENCOUNTER — Ambulatory Visit
Admission: RE | Admit: 2023-08-17 | Discharge: 2023-08-17 | Disposition: A | Discharge status: Home / Self Care | Payer: Self-pay | Source: Ambulatory Visit | Attending: Internal Medicine | Admitting: Internal Medicine

## 2023-08-17 DIAGNOSIS — Z1231 Encounter for screening mammogram for malignant neoplasm of breast: Secondary | ICD-10-CM | POA: Diagnosis present

## 2023-08-19 ENCOUNTER — Other Ambulatory Visit: Payer: Self-pay | Admitting: *Deleted

## 2023-08-19 ENCOUNTER — Inpatient Hospital Stay
Admission: RE | Admit: 2023-08-19 | Discharge: 2023-08-19 | Disposition: A | Discharge status: Home / Self Care | Payer: Self-pay | Source: Ambulatory Visit | Attending: Internal Medicine | Admitting: Internal Medicine

## 2023-08-19 DIAGNOSIS — Z1231 Encounter for screening mammogram for malignant neoplasm of breast: Secondary | ICD-10-CM

## 2024-05-11 NOTE — Progress Notes (Signed)
 ANNUAL EXAM  Lisa Bryan is a 63 y.o. 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  Having left sciatica, more more.  Has a lesion on her left cheek she is concerned about.  Blood pressure well-controlled ______________________________________________________________________ A comprehensive ROS was negative in all 10 systems reviewed.  ALLERGIES: Ace inhibitors, Iodinated contrast media, and Tetracyclines  Past Medical History:  Diagnosis Date   Endometriosis    ovarian cyst   Erosive esophagitis 05/26/14   GERD (gastroesophageal reflux disease)    questionable esophageal stricture   Helicobacter pylori (H. pylori) infection 05/26/14   treated   Osteoporosis, post-menopausal    s/p Boniva   Tortuous colon 02/10/14    Past Surgical History:  Procedure Laterality Date   COLONOSCOPY  02/10/14   repeat 5 years per [facility]   EGD  05/26/14   mucosa in gastric body biopsied negative   Cervical biospy/laparoscopy     CHOLECYSTECTOMY     HYSTERECTOMY     total    Current Outpatient Medications  Medication Sig Dispense Refill   cholecalciferol, vitamin D3, (VITAMIN D3) 125 mcg (5,000 unit) tablet Take 5,000 Units by mouth once daily     fluticasone propionate (FLONASE) 50 mcg/actuation nasal spray USE TWO SPRAYS IN EACH NOSTRIL EVERY DAY 16 g 11   magnesium oxide 500 mg magnesium Tab Take 500 mg by mouth once daily     olmesartan (BENICAR) 20 MG tablet Take 1 tablet (20 mg total) by mouth once daily 90 tablet 3   omeprazole (PRILOSEC) 20 MG DR capsule Take 1 capsule (20 mg total) by mouth once daily 90 capsule 3   traMADoL (ULTRAM) 50 mg tablet TAKE ONE TABLET BY MOUTH ONCE DAILY AS NEEDED 50 tablet 3   verapamiL (CALAN-SR) 120 MG SR tablet TAKE ONE TABLET BY MOUTH AT BEDTIME 90 tablet 3   zolpidem (AMBIEN) 5 MG tablet TAKE ONE TABLET BY MOUTH AT  BEDTIME AS NEEDED FOR SLEEP 90 tablet 1   No current facility-administered medications for this visit.    PHYSICAL EXAM: BP 116/80   Pulse 72   Wt 79.3 kg (174 lb 12.8 oz)   SpO2 97%   BMI 30.96 kg/m  Body mass index is 30.96 kg/m.  Wt Readings from Last 3 Encounters:  05/11/24 79.3 kg (174 lb 12.8 oz)  02/09/24 77.8 kg (171 lb 9.6 oz)  09/30/23 77.6 kg (171 lb)     BP Readings from Last 3 Encounters:  05/11/24 116/80  02/09/24 120/80  09/30/23 118/78    General: Alert oriented x3  Skin: No suspicious lesions or moles.   Eyes: Sclera and conjunctiva clear; pupils equal round and reactive to light and accommodation; extraocular movements intact Ears: External ears and canal normal; tympanic membranes normal.   Nose: Mucosa healthy without drainage or ulceration Oropharynx: No suspicious lesions Neck: No swelling, masses, stiffness, pain, limited movement, carotid pulses normal bilaterally, thyroid normal size, no masses palpated. No bruits heard. Skin-small actinic keratosis left cheek Lungs: Respirations unlabored; clear to auscultation bilaterally Back: No spinal deformity Cardiovascular: Heart regular rate and rhythm without murmurs, gallops, or rubs Abdomen: Soft; non tender; non distended;  no masses or organomegaly Lymph Nodes: No significant cervical, supraclavicular, or axillary lymphadenopathy noted Musculoskeletal: No active joint inflammation Extremities: Normal, no edema Pulses: Dorsalis pedis palpable and symmetric bilaterally Neurologic: Alert and oriented times 3; speech intact; face  symmetrical; moves all extremities well    ASSESSMENT/PLAN  Left sciatica-minimal use of anti-inflammatories, if it worsens she will need an LS MRI and x-ray and further evaluation.  Has been going on for quite some time, rare use of tramadol Insomnia-Ambien Hypertension-doing well on verapamil and Benicar Actinic keratosis-left cheek, treated with with liquid nitrogen, if  does not resolve will need biopsy Colonoscopy 2028 mammogram 3/25   Goals Addressed               This Visit's Progress    * Lose Weight (pt-stated)   Not on track     Patient would like to lose 15lbs within the next year.      Maintain health/healthy lifestyle   On track       .phq2  Return in about 1 year (around 05/11/2025) for physical.             *Some images could not be shown.

## 2024-05-11 NOTE — Procedures (Signed)
 After time out occurred and informed consent obtained one actinic keratoses on the left face was treated with liquid nitrogen without apparent complication. The patient was instructed to contact me if they were to have any lesion that does not resolve.

## 2024-06-09 ENCOUNTER — Emergency Department: Admitting: General Practice

## 2024-06-09 ENCOUNTER — Observation Stay

## 2024-06-09 ENCOUNTER — Emergency Department

## 2024-06-09 ENCOUNTER — Observation Stay
Admission: EM | Admit: 2024-06-09 | Discharge: 2024-06-10 | Disposition: A | Discharge status: Home / Self Care | Attending: Podiatry | Admitting: Podiatry

## 2024-06-09 ENCOUNTER — Other Ambulatory Visit: Payer: Self-pay

## 2024-06-09 ENCOUNTER — Encounter
Admission: EM | Disposition: A | Discharge status: Home / Self Care | Payer: Self-pay | Source: Home / Self Care | Attending: Emergency Medicine

## 2024-06-09 DIAGNOSIS — K219 Gastro-esophageal reflux disease without esophagitis: Secondary | ICD-10-CM | POA: Diagnosis not present

## 2024-06-09 DIAGNOSIS — M810 Age-related osteoporosis without current pathological fracture: Secondary | ICD-10-CM | POA: Diagnosis present

## 2024-06-09 DIAGNOSIS — S82851A Displaced trimalleolar fracture of right lower leg, initial encounter for closed fracture: Secondary | ICD-10-CM | POA: Diagnosis not present

## 2024-06-09 DIAGNOSIS — I10 Essential (primary) hypertension: Secondary | ICD-10-CM | POA: Diagnosis not present

## 2024-06-09 DIAGNOSIS — S82851S Displaced trimalleolar fracture of right lower leg, sequela: Secondary | ICD-10-CM

## 2024-06-09 DIAGNOSIS — S82891A Other fracture of right lower leg, initial encounter for closed fracture: Secondary | ICD-10-CM | POA: Diagnosis present

## 2024-06-09 HISTORY — PX: ORIF ANKLE FRACTURE: SHX5408

## 2024-06-09 LAB — CBC WITH DIFFERENTIAL/PLATELET
Abs Immature Granulocytes: 0.03 K/uL (ref 0.00–0.07)
Basophils Absolute: 0 K/uL (ref 0.0–0.1)
Basophils Relative: 0 %
Eosinophils Absolute: 0 K/uL (ref 0.0–0.5)
Eosinophils Relative: 0 %
HCT: 39.5 % (ref 36.0–46.0)
Hemoglobin: 12.9 g/dL (ref 12.0–15.0)
Immature Granulocytes: 0 %
Lymphocytes Relative: 16 %
Lymphs Abs: 1.5 K/uL (ref 0.7–4.0)
MCH: 28.5 pg (ref 26.0–34.0)
MCHC: 32.7 g/dL (ref 30.0–36.0)
MCV: 87.4 fL (ref 80.0–100.0)
Monocytes Absolute: 0.4 K/uL (ref 0.1–1.0)
Monocytes Relative: 4 %
Neutro Abs: 7.6 K/uL (ref 1.7–7.7)
Neutrophils Relative %: 80 %
Platelets: 310 K/uL (ref 150–400)
RBC: 4.52 MIL/uL (ref 3.87–5.11)
RDW: 12.1 % (ref 11.5–15.5)
WBC: 9.6 K/uL (ref 4.0–10.5)
nRBC: 0 % (ref 0.0–0.2)

## 2024-06-09 LAB — BASIC METABOLIC PANEL WITH GFR
Anion gap: 14 (ref 5–15)
BUN: 11 mg/dL (ref 8–23)
CO2: 24 mmol/L (ref 22–32)
Calcium: 9.2 mg/dL (ref 8.9–10.3)
Chloride: 101 mmol/L (ref 98–111)
Creatinine, Ser: 0.68 mg/dL (ref 0.44–1.00)
GFR, Estimated: >60 mL/min
Glucose, Bld: 123 mg/dL — ABNORMAL HIGH (ref 70–99)
Potassium: 3.6 mmol/L (ref 3.5–5.1)
Sodium: 139 mmol/L (ref 135–145)

## 2024-06-09 LAB — VITAMIN D 25 HYDROXY (VIT D DEFICIENCY, FRACTURES): Vit D, 25-Hydroxy: 64.5 ng/mL (ref 30–100)

## 2024-06-09 MED ORDER — DICYCLOMINE HCL 20 MG PO TABS: 20.0000 mg | ORAL_TABLET | Freq: Two times a day (BID) | ORAL | Status: DC

## 2024-06-09 MED ORDER — OXYCODONE HCL 5 MG PO TABS
ORAL_TABLET | ORAL | Status: AC
  Filled 2024-06-09: qty 1

## 2024-06-09 MED ORDER — LIDOCAINE HCL (PF) 1 % IJ SOLN
INTRAMUSCULAR | Status: AC
  Filled 2024-06-09: qty 30

## 2024-06-09 MED ORDER — KETAMINE HCL 50 MG/5ML IJ SOSY
PREFILLED_SYRINGE | INTRAMUSCULAR | Status: AC
  Filled 2024-06-09: qty 5

## 2024-06-09 MED ORDER — ROCURONIUM BROMIDE 100 MG/10ML IV SOLN
INTRAVENOUS | Status: DC | PRN
  Administered 2024-06-09: 20 mg via INTRAVENOUS
  Administered 2024-06-09: 60 mg via INTRAVENOUS

## 2024-06-09 MED ORDER — ACETAMINOPHEN 500 MG PO TABS
1000.0000 mg | ORAL_TABLET | Freq: Once | ORAL | Status: AC
  Administered 2024-06-09: 1000 mg via ORAL
  Filled 2024-06-09: qty 2

## 2024-06-09 MED ORDER — ACETAMINOPHEN 10 MG/ML IV SOLN
INTRAVENOUS | Status: DC | PRN
  Administered 2024-06-09: 1000 mg via INTRAVENOUS

## 2024-06-09 MED ORDER — 0.9 % SODIUM CHLORIDE (POUR BTL) OPTIME
TOPICAL | Status: DC | PRN
  Administered 2024-06-09: 1000 mL

## 2024-06-09 MED ORDER — VITAMIN D (ERGOCALCIFEROL) 1.25 MG (50000 UNIT) PO CAPS: 50000.0000 [IU] | ORAL_CAPSULE | ORAL | Status: DC

## 2024-06-09 MED ORDER — SODIUM CHLORIDE 0.9% FLUSH
3.0000 mL | Freq: Two times a day (BID) | INTRAVENOUS | Status: DC
  Administered 2024-06-09 – 2024-06-10 (×3): 3 mL via INTRAVENOUS

## 2024-06-09 MED ORDER — HYDROMORPHONE HCL 1 MG/ML IJ SOLN: 1.0000 mg | INTRAMUSCULAR | Status: DC | PRN

## 2024-06-09 MED ORDER — PANTOPRAZOLE SODIUM 40 MG PO TBEC
40.0000 mg | DELAYED_RELEASE_TABLET | Freq: Every day | ORAL | Status: DC
  Administered 2024-06-09 – 2024-06-10 (×2): 40 mg via ORAL
  Filled 2024-06-09 (×2): qty 1

## 2024-06-09 MED ORDER — DEXMEDETOMIDINE HCL IN NACL 80 MCG/20ML IV SOLN
INTRAVENOUS | Status: DC | PRN
  Administered 2024-06-09 (×3): 8 ug via INTRAVENOUS

## 2024-06-09 MED ORDER — BUPIVACAINE HCL (PF) 0.5 % IJ SOLN
INTRAMUSCULAR | Status: AC
  Filled 2024-06-09: qty 30

## 2024-06-09 MED ORDER — KETOROLAC TROMETHAMINE 30 MG/ML IJ SOLN
30.0000 mg | Freq: Once | INTRAMUSCULAR | Status: DC
  Filled 2024-06-09: qty 1

## 2024-06-09 MED ORDER — CEFAZOLIN SODIUM-DEXTROSE 2-4 GM/100ML-% IV SOLN
INTRAVENOUS | Status: AC
  Filled 2024-06-09: qty 100

## 2024-06-09 MED ORDER — PROPOFOL 10 MG/ML IV BOLUS
0.5000 mg/kg | Freq: Once | INTRAVENOUS | Status: DC | PRN
  Filled 2024-06-09: qty 20

## 2024-06-09 MED ORDER — PROPOFOL 1000 MG/100ML IV EMUL
INTRAVENOUS | Status: AC
  Filled 2024-06-09: qty 100

## 2024-06-09 MED ORDER — BUPIVACAINE LIPOSOME 1.3 % IJ SUSP
INTRAMUSCULAR | Status: AC
  Filled 2024-06-09: qty 20

## 2024-06-09 MED ORDER — FENTANYL CITRATE (PF) 100 MCG/2ML IJ SOLN
INTRAMUSCULAR | Status: DC | PRN
  Administered 2024-06-09: 50 ug via INTRAVENOUS

## 2024-06-09 MED ORDER — FENTANYL CITRATE (PF) 100 MCG/2ML IJ SOLN
INTRAMUSCULAR | Status: AC
  Filled 2024-06-09: qty 2

## 2024-06-09 MED ORDER — PROPOFOL 10 MG/ML IV BOLUS
INTRAVENOUS | Status: DC | PRN
  Administered 2024-06-09: 150 mg via INTRAVENOUS

## 2024-06-09 MED ORDER — SODIUM CHLORIDE 0.9% FLUSH: 3.0000 mL | INTRAVENOUS | Status: DC | PRN

## 2024-06-09 MED ORDER — TRAZODONE HCL 50 MG PO TABS: 25.0000 mg | ORAL_TABLET | Freq: Every evening | ORAL | Status: DC | PRN

## 2024-06-09 MED ORDER — BUPIVACAINE HCL (PF) 0.5 % IJ SOLN
INTRAMUSCULAR | Status: DC | PRN
  Administered 2024-06-09: 10 mL

## 2024-06-09 MED ORDER — FENTANYL CITRATE (PF) 50 MCG/ML IJ SOSY
50.0000 ug | PREFILLED_SYRINGE | Freq: Once | INTRAMUSCULAR | Status: DC | PRN
  Filled 2024-06-09: qty 1

## 2024-06-09 MED ORDER — LIDOCAINE HCL (PF) 1 % IJ SOLN
INTRAMUSCULAR | Status: DC | PRN
  Administered 2024-06-09: 10 mL

## 2024-06-09 MED ORDER — MIDAZOLAM HCL (PF) 2 MG/2ML IJ SOLN: 1.0000 mg | Freq: Once | INTRAMUSCULAR | Status: DC

## 2024-06-09 MED ORDER — IRBESARTAN 150 MG PO TABS
150.0000 mg | ORAL_TABLET | Freq: Every day | ORAL | Status: DC
  Administered 2024-06-10: 150 mg via ORAL
  Filled 2024-06-09: qty 1

## 2024-06-09 MED ORDER — ONDANSETRON HCL 4 MG/2ML IJ SOLN
INTRAMUSCULAR | Status: DC | PRN
  Administered 2024-06-09: 4 mg via INTRAVENOUS

## 2024-06-09 MED ORDER — PROMETHAZINE HCL 25 MG PO TABS: 12.5000 mg | ORAL_TABLET | ORAL | Status: DC | PRN

## 2024-06-09 MED ORDER — BUPIVACAINE HCL (PF) 0.25 % IJ SOLN
INTRAMUSCULAR | Status: AC
  Filled 2024-06-09: qty 30

## 2024-06-09 MED ORDER — CEFAZOLIN SODIUM-DEXTROSE 2-3 GM-%(50ML) IV SOLR
INTRAVENOUS | Status: DC | PRN
  Administered 2024-06-09: 2 g via INTRAVENOUS

## 2024-06-09 MED ORDER — SODIUM CHLORIDE 0.9 % IV SOLN: INTRAVENOUS | Status: DC | PRN

## 2024-06-09 MED ORDER — MORPHINE SULFATE (PF) 2 MG/ML IV SOLN: 2.0000 mg | INTRAVENOUS | Status: DC | PRN

## 2024-06-09 MED ORDER — KETOROLAC TROMETHAMINE 60 MG/2ML IM SOLN
30.0000 mg | Freq: Once | INTRAMUSCULAR | Status: AC
  Administered 2024-06-09: 30 mg via INTRAMUSCULAR

## 2024-06-09 MED ORDER — SODIUM CHLORIDE (PF) 0.9 % IJ SOLN
INTRAMUSCULAR | Status: AC
  Filled 2024-06-09: qty 50

## 2024-06-09 MED ORDER — VERAPAMIL HCL ER 120 MG PO TBCR
120.0000 mg | EXTENDED_RELEASE_TABLET | Freq: Every day | ORAL | Status: DC
  Administered 2024-06-10: 120 mg via ORAL
  Filled 2024-06-09: qty 1

## 2024-06-09 MED ORDER — LIDOCAINE HCL (CARDIAC) PF 100 MG/5ML IV SOSY
PREFILLED_SYRINGE | INTRAVENOUS | Status: DC | PRN
  Administered 2024-06-09: 60 mg via INTRAVENOUS

## 2024-06-09 MED ORDER — ACETAMINOPHEN 325 MG PO TABS
650.0000 mg | ORAL_TABLET | Freq: Four times a day (QID) | ORAL | Status: DC | PRN
  Administered 2024-06-09 – 2024-06-10 (×2): 650 mg via ORAL
  Filled 2024-06-09 (×2): qty 2

## 2024-06-09 MED ORDER — TRANEXAMIC ACID-NACL 1000-0.7 MG/100ML-% IV SOLN
INTRAVENOUS | Status: AC
  Filled 2024-06-09: qty 100

## 2024-06-09 MED ORDER — SODIUM CHLORIDE 0.9 % IV BOLUS
1000.0000 mL | Freq: Once | INTRAVENOUS | Status: AC
  Administered 2024-06-09: 1000 mL via INTRAVENOUS

## 2024-06-09 MED ORDER — BUPIVACAINE HCL (PF) 0.25 % IJ SOLN
INTRAMUSCULAR | Status: DC | PRN
  Administered 2024-06-09: 19 mL

## 2024-06-09 MED ORDER — KETAMINE HCL 50 MG/5ML IJ SOSY
PREFILLED_SYRINGE | INTRAMUSCULAR | Status: DC | PRN
  Administered 2024-06-09: 20 mg via INTRAVENOUS
  Administered 2024-06-09: 30 mg via INTRAVENOUS

## 2024-06-09 MED ORDER — SUGAMMADEX SODIUM 200 MG/2ML IV SOLN
INTRAVENOUS | Status: DC | PRN
  Administered 2024-06-09: 200 mg via INTRAVENOUS

## 2024-06-09 MED ORDER — MIDAZOLAM HCL (PF) 2 MG/2ML IJ SOLN
INTRAMUSCULAR | Status: DC | PRN
  Administered 2024-06-09: 2 mg via INTRAVENOUS

## 2024-06-09 MED ORDER — OXYCODONE HCL 5 MG PO TABS
5.0000 mg | ORAL_TABLET | Freq: Four times a day (QID) | ORAL | Status: DC | PRN
  Filled 2024-06-09: qty 1

## 2024-06-09 MED ORDER — ACETAMINOPHEN 10 MG/ML IV SOLN
INTRAVENOUS | Status: AC
  Filled 2024-06-09: qty 100

## 2024-06-09 MED ORDER — MIDAZOLAM HCL 2 MG/2ML IJ SOLN
INTRAMUSCULAR | Status: AC
  Filled 2024-06-09: qty 2

## 2024-06-09 MED ORDER — TRANEXAMIC ACID-NACL 1000-0.7 MG/100ML-% IV SOLN
INTRAVENOUS | Status: DC | PRN
  Administered 2024-06-09: 1000 mg via INTRAVENOUS

## 2024-06-09 MED ORDER — FENTANYL CITRATE (PF) 50 MCG/ML IJ SOSY: 50.0000 ug | PREFILLED_SYRINGE | Freq: Once | INTRAMUSCULAR | Status: DC

## 2024-06-09 MED ORDER — PROPOFOL 10 MG/ML IV BOLUS
INTRAVENOUS | Status: AC
  Filled 2024-06-09: qty 20

## 2024-06-09 MED ORDER — FENTANYL CITRATE (PF) 100 MCG/2ML IJ SOLN
25.0000 ug | INTRAMUSCULAR | Status: DC | PRN
  Administered 2024-06-09: 25 ug via INTRAVENOUS

## 2024-06-09 MED ORDER — ZOLPIDEM TARTRATE 5 MG PO TABS
2.5000 mg | ORAL_TABLET | Freq: Every evening | ORAL | Status: DC | PRN
  Administered 2024-06-09: 2.5 mg via ORAL
  Filled 2024-06-09: qty 1

## 2024-06-09 MED ORDER — OXYCODONE HCL 5 MG/5ML PO SOLN: 5.0000 mg | Freq: Once | ORAL | Status: AC | PRN

## 2024-06-09 MED ORDER — DEXAMETHASONE SOD PHOSPHATE PF 10 MG/ML IJ SOLN
INTRAMUSCULAR | Status: DC | PRN
  Administered 2024-06-09: 10 mg via INTRAMUSCULAR

## 2024-06-09 MED ORDER — PROPOFOL 500 MG/50ML IV EMUL
INTRAVENOUS | Status: DC | PRN
  Administered 2024-06-09: 150 ug/kg/min via INTRAVENOUS

## 2024-06-09 MED ORDER — MORPHINE SULFATE (PF) 4 MG/ML IV SOLN: 4.0000 mg | INTRAVENOUS | Status: DC | PRN

## 2024-06-09 MED ORDER — PHENYLEPHRINE 80 MCG/ML (10ML) SYRINGE FOR IV PUSH (FOR BLOOD PRESSURE SUPPORT)
PREFILLED_SYRINGE | INTRAVENOUS | Status: DC | PRN
  Administered 2024-06-09 (×2): 120 ug via INTRAVENOUS

## 2024-06-09 MED ORDER — IRBESARTAN 150 MG PO TABS: 150.0000 mg | ORAL_TABLET | Freq: Every day | ORAL | Status: DC

## 2024-06-09 MED ORDER — PROPOFOL 10 MG/ML IV BOLUS
INTRAVENOUS | Status: AC | PRN
  Administered 2024-06-09 (×2): 50 mg via INTRAVENOUS

## 2024-06-09 MED ORDER — BUPIVACAINE HCL (PF) 0.5 % IJ SOLN
INTRAMUSCULAR | Status: AC
  Filled 2024-06-09: qty 10

## 2024-06-09 MED ORDER — OXYCODONE HCL 5 MG PO TABS
5.0000 mg | ORAL_TABLET | Freq: Once | ORAL | Status: AC | PRN
  Administered 2024-06-09: 5 mg via ORAL

## 2024-06-09 MED ORDER — IBUPROFEN 400 MG PO TABS
600.0000 mg | ORAL_TABLET | Freq: Four times a day (QID) | ORAL | Status: DC | PRN
  Administered 2024-06-09 – 2024-06-10 (×3): 600 mg via ORAL
  Filled 2024-06-09 (×3): qty 2

## 2024-06-09 MED ORDER — DEXAMETHASONE SOD PHOSPHATE PF 10 MG/ML IJ SOLN
INTRAMUSCULAR | Status: DC | PRN
  Administered 2024-06-09: 10 mg via INTRAVENOUS

## 2024-06-09 NOTE — Consult Note (Signed)
 "  INITIAL CONSULTATION    Lisa Bryan FMW:[id number] DOB: 1961-03-16 DOA: 06/09/2024  PCP: Lisa Oneil FALCON, MD Patient coming from: home via [facility] PACU  Reason for Consultation: management of medical issues perioperatively   HPI:  The patient is a 64 year old with a history of GERD with erosive esophagitis, HTN, and osteoporosis who presented to the ER early AM 06/09/2024 after suffering a mechanical fall with immediate pain in her right ankle.  Imaging in the ER revealed a trimalleolar fracture of the ankle with displacement in multiple planes.  Podiatry was consulted and the patient was taken directly to the OR for ORIF of her right ankle fracture.  [facility] was consulted to attend to the patient's medical issues during the course of her hospital stay.  At the present time the patient is afebrile with stable vitals.  There was reportedly a self-limited episode of emesis at some point at the end of her procedure, or possibly in recovery, but her ET tube was still in place at the time and she was able to be suctioned. She appears to be medically stable at present with no respiratory distress whatsoever.  I have reviewed her lab work in detail.  Electrolytes are balanced and renal function is normal.  Hemoglobin is normal with normal platelet count and normal WBC.  [facility] will continue to follow along with you to attend to her medical issues.   Assessment/Plan  Acute trimalleolar R ankle fracture  Now s/p ORIF per Podiatry - post-op care per Podiatry   HTN Well-controlled at baseline on verapamil and Benicar -well-controlled during hospitalization thus far - hold benicar until tomorrow with BP mildly low post-op - cont verapamil as able to avoid reflex tachycardia   GERD Continue her usual dose of Prilosec  Osteoporosis  Continue vitamin D3 supplementation as per her home dose  DVT prophylaxis: at discretion of Podiatry   Review of Systems: As per HPI otherwise 10 point review of systems  negative.   Past Medical History:  Diagnosis Date   GERD (gastroesophageal reflux disease)    Hypertension    Insomnia     Past Surgical History:  Procedure Laterality Date   ABDOMINAL HYSTERECTOMY     BREAST ENHANCEMENT SURGERY     20 yrs ago   COLONOSCOPY     ESOPHAGOGASTRODUODENOSCOPY      Family History  History reviewed. No pertinent family history.  Social History   reports that she has never smoked. She has never used smokeless tobacco. She reports that she does not drink alcohol and does not use drugs.  Allergies Allergies[1]  Prior to Admission medications  Medication Sig Start Date End Date Taking? Authorizing Provider  dicyclomine  (BENTYL ) 20 MG tablet Take 1 tablet (20 mg total) by mouth 2 (two) times daily. 07/01/14   [name], MD  fluticasone (FLONASE) 50 MCG/ACT nasal spray Place 1 spray into both nostrils daily as needed for allergies.  05/10/14   [provider]  hydrochlorothiazide (HYDRODIURIL) 25 MG tablet Take by mouth. 03/18/19   [provider]  ibuprofen (ADVIL,MOTRIN) 200 MG tablet Take 600 mg by mouth daily as needed for moderate pain.    [provider]  naproxen  (NAPROSYN ) 500 MG tablet Take 1 tablet (500 mg total) by mouth 2 (two) times daily. 02/10/22   [name], PA-C  olmesartan (BENICAR) 20 MG tablet Take 20 mg by mouth daily. 09/15/19   [provider]  ondansetron  (ZOFRAN ) 4 MG tablet Take 1 tablet (  4 mg total) by mouth every 6 (six) hours. 07/01/14   [name], MD  oxyCODONE -acetaminophen  (PERCOCET) 5-325 MG per tablet Take 1 tablet by mouth every 4 (four) hours as needed. Patient not taking: Reported on 11/28/2019 07/01/14   [name], MD  pantoprazole (PROTONIX) 20 MG tablet Take 20 mg by mouth 2 (two) times daily.    [provider]  traMADol (ULTRAM) 50 MG tablet Take 1 tablet by mouth daily as needed for moderate pain.  06/03/14   [provider]  verapamil (CALAN-SR) 120 MG CR tablet Take by mouth. 03/18/19 03/17/20  [provider]  Vitamin D, Ergocalciferol, (DRISDOL) 50000 UNITS CAPS capsule Take 1 capsule by mouth once a week. Take on Sunday. 05/23/14   [provider]  zolpidem (AMBIEN) 5 MG tablet Take 2.5 mg by mouth at bedtime as needed for sleep.    [provider]    Physical Exam: Vitals:   06/09/24 0800 06/09/24 1111 06/09/24 1115 06/09/24 1130  BP: 131/64 (!) 104/56 (!) 103/54 114/61  Pulse: 90 66 73   Resp: 20 14 17 20   Temp:  (!) 96.9 F (36.1 C)  (!) 96.5 F (35.8 C)  TempSrc:      SpO2: 98% (!) 86% 99% 96%  Weight:        General: No acute respiratory distress Lungs: Clear to auscultation bilaterally without wheezes or crackles Cardiovascular: Regular rate and rhythm without murmur gallop or rub normal S1 and S2 Abdomen: Nontender, nondistended, soft, bowel sounds positive, no rebound, no ascites, no appreciable mass Extremities: No significant cyanosis, clubbing, or edema bilateral lower extremities - R LE dressed in postoperative dressing    Labs on Admission:   CBC: Recent Labs  Lab 06/09/24 0515  WBC 9.6  NEUTROABS 7.6  HGB 12.9  HCT 39.5  MCV 87.4  PLT 310   Basic Metabolic Panel: Recent Labs  Lab 06/09/24 0515  NA 139  K 3.6  CL 101  CO2 24  GLUCOSE 123*  BUN 11  CREATININE 0.68  CALCIUM 9.2    Radiological Exams on Admission:  DG Ankle 2 Views Right Result Date: 06/09/2024 IMPRESSION: Intraoperative fluoroscopy of trimalleolar ankle fracture fixation with improved alignment, as described above. Electronically Signed   By: Harrietta Sherry M.D.   On: 06/09/2024 11:24   CT Ankle Right Wo Contrast Result Date: 06/09/2024 IMPRESSION: 1. Acute displaced trimalleolar ankle fractures, as described above, with posterolateral dislocation of the talus relative to the tibia. 2. Acute minimally displaced intra-articular fracture of the medial base of the second  metatarsal with punctate foci of osseous fragments at the level of the Lisfranc interval and diastasis at the level of the Lisfranc joint measuring up to 5 mm, most compatible with Lisfranc ligament injury/insufficiency. 3. Acute minimally displaced intra-articular fracture at the medial base of the fourth metatarsal. 4. Subcutaneous edema and soft tissue swelling of the lateral and medial ankle through the dorsal midfoot. Electronically Signed   By: Harrietta Sherry M.D.   On: 06/09/2024 11:21   DG Ankle Complete Right Result Date: 06/09/2024 IMPRESSION: 1. Acute trimalleolar fracture of the right ankle with associated ankle mortise disruption and posterolateral tibiotalar dislocation. 2. Diffuse soft tissue swelling. 3. Vascular calcifications. Electronically signed by: Dorethia Molt MD 06/09/2024 04:25 AM EST RP Workstation: [id number]     Reyes IVAR Moores, MD [facility] Office  [phone] Pager - Text Page per Amion as per below:  On-Call/Text Page:  [email or web address]  If 7PM-7AM, please contact night-coverage [email or web address] 06/09/2024, 11:51 AM        [1]  Allergies Allergen Reactions   Other Swelling   Tetracyclines & Related     swelling   "

## 2024-06-09 NOTE — Plan of Care (Signed)
" °  Problem: Education: Goal: Knowledge of the prescribed therapeutic regimen will improve Outcome: Progressing   Problem: Bowel/Gastric: Goal: Gastrointestinal status for postoperative course will improve Outcome: Progressing   Problem: Cardiac: Goal: Ability to maintain an adequate cardiac output Outcome: Progressing Goal: Will show no evidence of cardiac arrhythmias Outcome: Progressing   "

## 2024-06-09 NOTE — ED Triage Notes (Addendum)
 BIB [facility] from home with CC of mechanical fall while getting dressed. Heard loud pop from R ankle. Boot in place on arrival. Pt will not put pressure on foot.

## 2024-06-09 NOTE — Transfer of Care (Signed)
 Immediate Anesthesia Transfer of Care Note  Patient: Lisa Bryan  Procedure(s) Performed: OPEN REDUCTION INTERNAL FIXATION (ORIF) ANKLE FRACTURE (Right: Ankle)  Patient Location: PACU  Anesthesia Type:General  Level of Consciousness: drowsy  Airway & Oxygen Therapy: Patient Spontanous Breathing and Patient connected to face mask oxygen  Post-op Assessment: Report given to RN, Post -op Vital signs reviewed and stable, and Patient moving all extremities  Post vital signs: Reviewed and stable  Last Vitals:  Vitals Value Taken Time  BP 103/54 06/09/24 11:15  Temp    Pulse 74 06/09/24 11:24  Resp 22 06/09/24 11:24  SpO2 100 % 06/09/24 11:24  Vitals shown include unfiled device data.  Last Pain:  Vitals:   06/09/24 1111  TempSrc:   PainSc: Asleep         Complications: No notable events documented.

## 2024-06-09 NOTE — ED Provider Notes (Addendum)
 "  [facility] Provider Note    Event Date/Time   First MD Initiated Contact with Patient 06/09/24 [phone]     (approximate)   History   Fall and Ankle Pain   HPI  Lisa Bryan is a 64 y.o. female   Past medical history of GERD and hypertension who presents Emergency Department with ankle injury.  She was walking and twisted her right ankle and felt a pop and immediate pain.  She was not able to bear weight.  She did not fall or sustain any other injuries.  She has no other acute medical complaints.  Independent Historian contributed to assessment above: Husband corroborates information above  External Medical Documents Reviewed: Prior outpatient notes      Physical Exam   Triage Vital Signs: ED Triage Vitals  Encounter Vitals Group     BP --      Girls Systolic BP Percentile --      Girls Diastolic BP Percentile --      Boys Systolic BP Percentile --      Boys Diastolic BP Percentile --      Pulse Rate 06/09/24 0343 84     Resp --      Temp 06/09/24 0347 98 F (36.7 C)     Temp Source 06/09/24 0347 Oral     SpO2 06/09/24 0343 98 %     Weight --      Height --      Head Circumference --      Peak Flow --      Pain Score 06/09/24 0334 10     Pain Loc --      Pain Education --      Exclude from Growth Chart --     Most recent vital signs: Vitals:   06/09/24 0621 06/09/24 0631  BP: 134/69 112/61  Pulse: 91 95  Resp: 19 16  Temp: 98 F (36.7 C) 98 F (36.7 C)  SpO2: 100% 100%    General: Awake, no distress.  CV:  Good peripheral perfusion.  Resp:  Normal effort.  Abd:  No distention.  Other:  Neurovascular intact to the affected right lower extremity.  She has swelling bruising and tenderness to the medial side of the right ankle.  The remainder of that lower extremity is atraumatic and able to range at the other joints.  No other traumatic injuries noted on head to toe examination.   ED Results / Procedures / Treatments    Labs (all labs ordered are listed, but only abnormal results are displayed) Labs Reviewed  BASIC METABOLIC PANEL WITH GFR - Abnormal; Notable for the following components:      Result Value   Glucose, Bld 123 (*)    All other components within normal limits  CBC WITH DIFFERENTIAL/PLATELET      RADIOLOGY I independently reviewed and interpreted ankle x-ray and see multiple sites of fractures I also reviewed radiologist's formal read.   PROCEDURES:  Critical Care performed: No  .Reduction of fracture  Date/Time: 06/09/2024 6:28 AM  Performed by: Cyrena Mylar, MD Authorized by: Cyrena Mylar, MD  Consent: Verbal consent obtained Risks and benefits: risks, benefits and alternatives were discussed Consent given by: patient Patient understanding: patient states understanding of the procedure being performed Imaging studies: imaging studies available Patient identity confirmed: verbally with patient and arm band Time out: Immediately prior to procedure a time out was called to verify the correct patient, procedure, equipment, support staff and site/side  marked as required.  Sedation: Patient sedated: yes Sedation type: moderate (conscious) sedation Sedatives: propofol Analgesia: fentanyl Sedation start date/time: 06/09/2024 6:14 AM Sedation end date/time: 06/09/2024 6:29 AM Vitals: Vital signs were monitored during sedation.  Patient tolerance: patient tolerated the procedure well with no immediate complications   .Sedation  Date/Time: 06/09/2024 6:29 AM  Performed by: Cyrena Mylar, MD Authorized by: Cyrena Mylar, MD   Consent:    Consent obtained:  Verbal   Consent given by:  Patient   Alternatives discussed:  Analgesia without sedation Universal protocol:    Immediately prior to procedure, a time out was called: yes   Indications:    Procedure performed:  Fracture reduction   Procedure necessitating sedation performed by:  Physician performing sedation Pre-sedation  assessment:    Time since last food or drink:  Last night   NPO status caution: unable to specify NPO status     ASA classification: class 1 - normal, healthy patient     Mouth opening:  3 or more finger widths   Mallampati score:  I - soft palate, uvula, fauces, pillars visible   Neck mobility: normal     Pre-sedation assessments completed and reviewed: airway patency, cardiovascular function, hydration status, mental status, nausea/vomiting, pain level, respiratory function and temperature   A pre-sedation assessment was completed prior to the start of the procedure Immediate pre-procedure details:    Reassessment: Patient reassessed immediately prior to procedure     Reviewed: vital signs, relevant labs/tests and NPO status     Verified: bag valve mask available, emergency equipment available, intubation equipment available and IV patency confirmed   Procedure details (see MAR for exact dosages):    Preoxygenation:  Room air   Sedation:  Propofol   Intended level of sedation: deep and moderate (conscious sedation)   Analgesia:  Fentanyl   Intra-procedure monitoring:  Blood pressure monitoring, continuous capnometry, frequent LOC assessments, frequent vital sign checks, continuous pulse oximetry and cardiac monitor   Intra-procedure events: none     Total Provider sedation time (minutes):  20 Post-procedure details:   A post-sedation assessment was completed following the completion of the procedure.   Attendance: Constant attendance by certified staff until patient recovered     Recovery: Patient returned to pre-procedure baseline     Post-sedation assessments completed and reviewed: airway patency, cardiovascular function, hydration status, mental status, nausea/vomiting, pain level and respiratory function     Patient is stable for discharge or admission: yes     Procedure completion:  Tolerated well, no immediate complications .Splint Application  Date/Time: 06/09/2024 6:32  AM  Performed by: Cyrena Mylar, MD Authorized by: Cyrena Mylar, MD   Consent:    Consent obtained:  Verbal   Consent given by:  Patient   Risks discussed:  Discoloration and numbness   Alternatives discussed:  No treatment Universal protocol:    Procedure explained and questions answered to patient or proxy's satisfaction: yes     Patient identity confirmed:  Verbally with patient Pre-procedure details:    Distal neurologic exam:  Normal   Distal perfusion: distal pulses strong and brisk capillary refill   Procedure details:    Location:  Ankle   Ankle location:  R ankle   Lower extremity splint type: Posterior & stirrup.   Supplies:  Cotton padding, elastic bandage and fiberglass   Attestation: Splint applied and adjusted personally by me   Post-procedure details:    Distal neurologic exam:  Normal   Distal perfusion: brisk capillary  refill     Procedure completion:  Tolerated    MEDICATIONS ORDERED IN ED: Medications  fentaNYL (SUBLIMAZE) injection 50 mcg (has no administration in time range)  propofol (DIPRIVAN) 10 mg/mL bolus/IV push 42.1 mg (has no administration in time range)  propofol (DIPRIVAN) 10 mg/mL bolus/IV push 42.1 mg (has no administration in time range)  acetaminophen  (TYLENOL ) tablet 1,000 mg (1,000 mg Oral Given 06/09/24 0359)  ketorolac (TORADOL) injection 30 mg (30 mg Intramuscular Given 06/09/24 0401)  sodium chloride 0.9 % bolus 1,000 mL (1,000 mLs Intravenous New Bag/Given 06/09/24 0534)  propofol (DIPRIVAN) 10 mg/mL bolus/IV push (50 mg Intravenous Given 06/09/24 0617)     IMPRESSION / MDM / ASSESSMENT AND PLAN / ED COURSE  I reviewed the triage vital signs and the nursing notes.                                Patient's presentation is most consistent with acute presentation with potential threat to life or bodily function.  Differential diagnosis includes, but is not limited to, ankle fracture or dislocation, contusion, ligamentous injury,  neurovascular injury   MDM:    Twisting of the ankle was the only injury got an x-ray which shows trimalleolar fracture with displacement in multiple planes, discussed with podiatry on-call Dr. Lamount for care plan and disposition.  Plan be for procedural sedation and reduction, splinting, repeat imaging after reduction and then we will touch base once again with podiatrist for care plan.  I explained this plan to the patient and her husband.  She gives consent to proceed with procedural sedation and reduction.  -- See procedure notes for uncomplicated procedural sedation and reduction.  Reduced as best we could and splinted, though complicated by notable instability during the reduction attempt.  Assessed afterwards and sensory intact with brisk cap refill.  Post reduction CT imaging is pending  Podiatry plans to take to the operating room this morning.       FINAL CLINICAL IMPRESSION(S) / ED DIAGNOSES   Final diagnoses:  Closed right trimalleolar fracture, initial encounter     Rx / DC Orders   ED Discharge Orders     None        Note:  This document was prepared using Dragon voice recognition software and may include unintentional dictation errors.    Cyrena Mylar, MD 06/09/24 9352    Cyrena Mylar, MD 06/09/24 [phone]  "

## 2024-06-09 NOTE — Anesthesia Procedure Notes (Signed)
 Procedure Name: Intubation Date/Time: 06/09/2024 8:29 AM  Performed by: Landy Francena BIRCH, CRNAPre-anesthesia Checklist: Patient identified, Emergency Drugs available, Suction available and Patient being monitored Patient Re-evaluated:Patient Re-evaluated prior to induction Oxygen Delivery Method: Circle system utilized Preoxygenation: Pre-oxygenation with 100% oxygen Induction Type: IV induction Ventilation: Mask ventilation without difficulty and Oral airway inserted - appropriate to patient size Laryngoscope Size: McGrath and 3 Grade View: Grade I Tube type: Oral Tube size: 7.0 mm Number of attempts: 1 Airway Equipment and Method: Stylet, Oral airway and Bite block Placement Confirmation: ETT inserted through vocal cords under direct vision, positive ETCO2 and breath sounds checked- equal and bilateral Secured at: 21 cm Tube secured with: Tape Dental Injury: Teeth and Oropharynx as per pre-operative assessment

## 2024-06-09 NOTE — ED Notes (Signed)
 Patient transported to CT

## 2024-06-09 NOTE — H&P (Signed)
 SURGICAL HISTORY and PHYSICAL FOOT & ANKLE SURGERY    Date Time: 06/09/2024 1:33 PM Physician: Dr. Ethan LITTIE Saddler, DPM    History of Presenting Illness: Lisa Bryan is a 64 y.o. year old female presenting to [facility] following right ankle injury in which she sustained closed trimalleolar fracture after twisting her ankle and falling. Past medical history of GERD, hypertension, osteoporosis.podiatry was consulted for further management as previously noted initial consult note.  She underwent ORIF of right ankle this morning with Dr. Prentice Ovens as primary surgeon.  She is seen resting at bedside.  Plan to admit for observation for pain control following her surgery.  Reports pain is well-controlled.  Denies any nausea, vomiting, fever, chills, chest pain, shortness of breath.  Past Medical History: Past Medical History:  Diagnosis Date   GERD (gastroesophageal reflux disease)    Hypertension    Insomnia     Past Surgical History: Past Surgical History:  Procedure Laterality Date   ABDOMINAL HYSTERECTOMY     BREAST ENHANCEMENT SURGERY     20 yrs ago   COLONOSCOPY     ESOPHAGOGASTRODUODENOSCOPY      Allergies: Allergies[1]  Home Medications: Current Facility-Administered Medications  Medication Dose Route Frequency Provider Last Rate Last Admin   acetaminophen  (TYLENOL ) tablet 650 mg  650 mg Oral Q6H PRN Whittney Steenson L, DPM       dicyclomine  (BENTYL ) tablet 20 mg  20 mg Oral BID Danton Reyes DASEN, MD       fentaNYL (SUBLIMAZE) injection 50 mcg  50 mcg Intravenous Once PRN Cyrena Mylar, MD       fentaNYL (SUBLIMAZE) injection 50 mcg  50 mcg Intravenous Once Woodson, Thomas, MD       ibuprofen (ADVIL) tablet 600 mg  600 mg Oral Q6H PRN Saddler Ethan L, DPM   600 mg at 06/09/24 1410   [START ON 06/10/2024] irbesartan (AVAPRO) tablet 150 mg  150 mg Oral Daily Danton Reyes DASEN, MD       midazolam PF (VERSED) injection 1 mg  1 mg Intravenous Once Leavy Ned, MD       morphine (PF) 2 MG/ML injection 2 mg  2 mg Intravenous Q2H PRN Flor Whitacre L, DPM       morphine (PF) 4 MG/ML injection 4 mg  4 mg Intravenous Q2H PRN Brentlee Sciara L, DPM       oxyCODONE  (Oxy IR/ROXICODONE ) immediate release tablet 5 mg  5 mg Oral Q6H PRN Kamora Vossler L, DPM       pantoprazole (PROTONIX) EC tablet 40 mg  40 mg Oral Daily Danton Reyes DASEN, MD   40 mg at 06/09/24 1410   promethazine (PHENERGAN) tablet 12.5 mg  12.5 mg Oral Q4H PRN Zyla Dascenzo L, DPM       propofol (DIPRIVAN) 10 mg/mL bolus/IV push 42.1 mg  0.5 mg/kg Intravenous Once PRN Cyrena Mylar, MD       propofol (DIPRIVAN) 10 mg/mL bolus/IV push 42.1 mg  0.5 mg/kg Intravenous Once PRN Cyrena Mylar, MD       sodium chloride flush (NS) 0.9 % injection 3 mL  3 mL Intravenous Q12H Kassidy Frankson L, DPM   3 mL at 06/09/24 1411   sodium chloride flush (NS) 0.9 % injection 3 mL  3 mL Intravenous PRN Amali Uhls L, DPM       traZODone (DESYREL) tablet 25 mg  25 mg Oral QHS PRN Saddler Ethan LITTIE, DPM  verapamil (CALAN-SR) CR tablet 120 mg  120 mg Oral Daily Danton Reyes DASEN, MD       Vitamin D (Ergocalciferol) (DRISDOL) 1.25 MG (50000 UNIT) capsule 50,000 Units  50,000 Units Oral Weekly Danton Reyes DASEN, MD       zolpidem (AMBIEN) tablet 2.5 mg  2.5 mg Oral QHS PRN Danton Reyes DASEN, MD       Medications Prior to Admission  Medication Sig Dispense Refill Last Dose/Taking   dicyclomine  (BENTYL ) 20 MG tablet Take 1 tablet (20 mg total) by mouth 2 (two) times daily. 20 tablet 0    fluticasone (FLONASE) 50 MCG/ACT nasal spray Place 1 spray into both nostrils daily as needed for allergies.       hydrochlorothiazide (HYDRODIURIL) 25 MG tablet Take by mouth.      ibuprofen (ADVIL,MOTRIN) 200 MG tablet Take 600 mg by mouth daily as needed for moderate pain.      naproxen  (NAPROSYN ) 500 MG tablet Take 1 tablet (500 mg total) by mouth 2 (two) times daily. 30 tablet 0    olmesartan (BENICAR) 20 MG tablet Take 20 mg by mouth  daily.      ondansetron  (ZOFRAN ) 4 MG tablet Take 1 tablet (4 mg total) by mouth every 6 (six) hours. 20 tablet 0    oxyCODONE -acetaminophen  (PERCOCET) 5-325 MG per tablet Take 1 tablet by mouth every 4 (four) hours as needed. (Patient not taking: Reported on 11/28/2019) 20 tablet 0    pantoprazole (PROTONIX) 20 MG tablet Take 20 mg by mouth 2 (two) times daily.      traMADol (ULTRAM) 50 MG tablet Take 1 tablet by mouth daily as needed for moderate pain.       verapamil (CALAN-SR) 120 MG CR tablet Take by mouth.      Vitamin D, Ergocalciferol, (DRISDOL) 50000 UNITS CAPS capsule Take 1 capsule by mouth once a week. Take on Sunday.      zolpidem (AMBIEN) 5 MG tablet Take 2.5 mg by mouth at bedtime as needed for sleep.        Social History: @IPSOCHX @  Family History: History reviewed. No pertinent family history.  Review of Systems: As per HPI. A complete 10 point review of systems was performed and all other systems reviewed were negative.  Physical Exam: Vitals:   06/09/24 1215 06/09/24 1304  BP: 133/80 (!) 145/84  Pulse: 71   Resp: 20 18  Temp: 98.1 F (36.7 C) 98.3 F (36.8 C)  SpO2: 97% 98%    Lower Extremity Physical Exam  Vasc: Posterior splint left intact.  Cap refill intact to the digits right foot.  No cyanosis or pallor noted Neuro: Light touch sensation intact MSK: Status post right trimalleolar ORIF Derm: Dressings intact.  At time of significant bruising medial ankle with edema about right ankle.  Medial ankle and posterior lateral incisions present.    Physical Exam Constitutional:      Appearance: Normal appearance.  HENT:     Head: Normocephalic.  Cardiovascular:     Rate and Rhythm: Normal rate and regular rhythm.  Pulmonary:     Effort: Pulmonary effort is normal.  Skin:    General: Skin is warm and dry.  Neurological:     Mental Status: She is alert.      Labs:  - Reviewed prior to examination  Radiology: Right ankle post operative  radiographs Narrative & Impression  CLINICAL DATA:  Status post ORIF of ankle fracture.   EXAM: RIGHT ANKLE - COMPLETE 3+  VIEW   COMPARISON:  Preoperative imaging   FINDINGS: Lateral plate and multi screw fixation of the distal fibula. Screws traverse the medial malleolar and posterior tibial tubercle fracture. Improved fracture alignment from preoperative imaging. Improved mortise alignment. Recent postsurgical change includes air and edema in the soft tissues with overlying skin staples in place. Splint material posteriorly.   IMPRESSION: ORIF of trimalleolar ankle fracture. No immediate postoperative complication.     Electronically Signed   By: Andrea Gasman M.D.   On: 06/09/2024 12:54    Assessment: ROSAMAE ROCQUE is 64 y.o. female who is status post right ankle trimalleolar ORIF earlier this morning on 06/09/2024.  She is admitted for observation and pain control.  Plan: - No other active hospital problems, hospitalist service consulted for medical management of home medications for GERD, hypertension, osteoporosis -Continue multimodal pain control -DVT prophylaxis: SCDs in place at this point, may begin pharmacological prophylaxis postop day 1 -Weightbearing status: Nonweightbearing right lower extremity.  PT OT consulted. -Dressings: Leave intact -Keep lower extremity elevated and apply ice behind right knee every 20 minutes as needed -Anticipated date of discharge 06/10/2024 after working with PT OT.  Plan for patient to follow-up with primary surgeon Dr. Prentice Ovens in [place] office early next week for postoperative care.  Dr. Ethan LITTIE Saddler, DPM     [1]  Allergies Allergen Reactions   Other Swelling   Tetracyclines & Related     swelling

## 2024-06-09 NOTE — Consult Note (Addendum)
 "  PODIATRY CONSULTATION  NAME Lisa Bryan MRN [id number] DOB Jan 26, 1961 DOA 06/09/2024   Reason for consult:  Chief Complaint  Patient presents with   Fall   Ankle Pain    Attending/Consulting physician:  Dr. Ginnie Shams History of present illness: 64 y.o. female with past medical history of GERD, hypertension, osteoporosis presenting to the ED after a fall in which she injured her right ankle.  She denies any other injuries.  She states she was walking, twisted the ankle, felt a pop and had immediate pain.  She has been unable to bear weight.  Currently in posterior splint right ankle.  Past Medical History:  Diagnosis Date   GERD (gastroesophageal reflux disease)    Hypertension    Insomnia        Latest Ref Rng & Units 06/09/2024    5:15 AM 07/01/2014    2:18 PM  CBC  WBC 4.0 - 10.5 K/uL 9.6  8.2   Hemoglobin 12.0 - 15.0 g/dL 87.0  86.1   Hematocrit 36.0 - 46.0 % 39.5  41.9   Platelets 150 - 400 K/uL 310  331        Latest Ref Rng & Units 06/09/2024    5:15 AM 07/01/2014    2:18 PM  BMP  Glucose 70 - 99 mg/dL 876  87   BUN 8 - 23 mg/dL 11  12   Creatinine 9.55 - 1.00 mg/dL 9.31  9.27   Sodium 864 - 145 mmol/L 139  140   Potassium 3.5 - 5.1 mmol/L 3.6  3.7   Chloride 98 - 111 mmol/L 101  104   CO2 22 - 32 mmol/L 24  31   Calcium 8.9 - 10.3 mg/dL 9.2  9.7       Physical Exam: Lower Extremity Exam Vasc: DP and PT pulses intact.  Cap refill intact to the digits.  No cyanosis or pallor to the digits.  Edema present right ankle, bruising present medial ankle right side  Derm: Skin intact to lower extremities.  No open lesions  MSK:  Right ankle deformity present  Neuro: Gross and light touch sensation intact  Right ankle 3 views radiographs: IMPRESSION: 1. Acute trimalleolar fracture of the right ankle with associated ankle mortise disruption and posterolateral tibiotalar dislocation. 2. Diffuse soft tissue swelling. 3. Vascular  calcifications.  Pre-reduction CT scan right ankle Independently reviewed.  Posteriorly displaced trimalleolar ankle fracture.  Medial malleolar avulsion fragment with medial displacement present.  Obliquely oriented distal fibular fracture.  Posterior malleolus fracture is present with mild degree of comminution.   ASSESSMENT/PLAN OF CARE 64 y.o. female with PMHx significant for GERD, hypertension, osteoporosis with right displaced closed trimalleolar ankle fracture  - Findings discussed with patient - Based on fracture pattern discussed need for open reduction internal fixation of right ankle. My partner Dr. Prentice Ovens will be primary surgeon on this procedure. -Informed surgical risk consent was reviewed with patient.  I reviewed the films.  I have discussed my findings with the patient in great detail.  I have discussed all risks including but not limited to infection, stiffness, scarring, limp, disability, deformity, damage to blood vessels and nerves, numbness, poor healing, need for braces, arthritis, chronic pain, amputation, death.  All benefits and realistic expectations discussed in great detail.  I have made no promises as to the outcome.  I have provided realistic expectations.  They expressed good understanding and have elected to proceed -Plan for OR morning of 06/10/2023 -Diet: NPO -  Weightbearing status: Nonweightbearing - Discussed with patient and with husband present.  Likely plan to be admitted for observation postoperatively for pain control.   Thank you for the consult.  Please contact me directly with any questions or concerns.           Ethan Saddler, DPM [facility]    [address] [place], [place] [address]                Office [phone]  Fax [phone]     "

## 2024-06-09 NOTE — Progress Notes (Signed)
 History and Physical Interval Note:  06/09/2024 7:41 AM  Lisa Bryan  has presented today for surgery, with the diagnosis of displaced and dislocated right trimalleolar ankle fracture. I discussed the case with Dr. Lamount, DPM. The various methods of treatment have been discussed with the patient and family. After consideration of risks, benefits and other options for treatment, the patient has consented to   Procedures: OPEN REDUCTION INTERNAL FIXATION (ORIF) ANKLE FRACTURE (Right) as a surgical intervention.  The patient's history has been reviewed, patient examined, no change in status, stable for surgery.  I have reviewed the patient's chart and labs.  Questions were answered to the patient's satisfaction.     Lisa Bryan  Lisa Bryan

## 2024-06-09 NOTE — Brief Op Note (Signed)
 06/09/2024  11:06 AM  PATIENT:  Lisa Bryan  64 y.o. female  PRE-OPERATIVE DIAGNOSIS:  osteoporosis with right displaced closed trimalleolar ankle fracture  POST-OPERATIVE DIAGNOSIS:  osteoporosis with right displaced closed trimalleolar ankle fracture  PROCEDURE:  Procedures: OPEN REDUCTION INTERNAL FIXATION (ORIF) ANKLE FRACTURE (Right)  SURGEON:  Surgeons and Role:    * Ciro Tashiro, Prentice PARAS, DPM - Primary    *   ASSISTANTS: Lamount Ethan CROME, DPM   ANESTHESIA:   general with local block  EBL:  10 mL   BLOOD ADMINISTERED:none  DRAINS: none   LOCAL MEDICATIONS USED:  MARCAINE    and LIDOCAINE and dex phosphate  SPECIMEN:  No Specimen  DISPOSITION OF SPECIMEN:  N/A  COUNTS:  YES  TOURNIQUET:   Total Tourniquet Time Documented: Thigh (Right) - 104 minutes Total: Thigh (Right) - 104 minutes   DICTATION: .Note written in EPIC  PLAN OF CARE: Admit for overnight observation  PATIENT DISPOSITION:  PACU - hemodynamically stable.   Delay start of Pharmacological VTE agent (>24hrs) due to surgical blood loss or risk of bleeding: Start tomorrow morning

## 2024-06-09 NOTE — Anesthesia Preprocedure Evaluation (Signed)
"                                    Anesthesia Evaluation  Patient identified by MRN, date of birth, ID band Patient awake    Reviewed: Allergy & Precautions, NPO status , Patient's Chart, lab work & pertinent test results  Airway Mallampati: I  TM Distance: >3 FB Neck ROM: full    Dental  (+) Dental Advidsory Given   Pulmonary neg pulmonary ROS   Pulmonary exam normal        Cardiovascular hypertension, On Medications negative cardio ROS Normal cardiovascular exam     Neuro/Psych negative neurological ROS  negative psych ROS   GI/Hepatic Neg liver ROS,GERD  Medicated and Controlled,,  Endo/Other  negative endocrine ROS    Renal/GU      Musculoskeletal   Abdominal   Peds  Hematology negative hematology ROS (+)   Anesthesia Other Findings Past Medical History: No date: GERD (gastroesophageal reflux disease) No date: Hypertension No date: Insomnia  Past Surgical History: No date: ABDOMINAL HYSTERECTOMY No date: BREAST ENHANCEMENT SURGERY     Comment:  20 yrs ago No date: COLONOSCOPY No date: ESOPHAGOGASTRODUODENOSCOPY  BMI    Body Mass Index: 31.86 kg/m      Reproductive/Obstetrics negative OB ROS                              Anesthesia Physical Anesthesia Plan  ASA: 2  Anesthesia Plan: General ETT   Post-op Pain Management:    Induction: Intravenous  PONV Risk Score and Plan: 3 and Ondansetron , Dexamethasone and Midazolam  Airway Management Planned: Oral ETT  Additional Equipment:   Intra-op Plan:   Post-operative Plan: Extubation in OR  Informed Consent: I have reviewed the patients History and Physical, chart, labs and discussed the procedure including the risks, benefits and alternatives for the proposed anesthesia with the patient or authorized representative who has indicated his/her understanding and acceptance.     Dental Advisory Given  Plan Discussed with: Anesthesiologist, CRNA and  Surgeon  Anesthesia Plan Comments: (Patient consented for risks of anesthesia including but not limited to:  - adverse reactions to medications - damage to eyes, teeth, lips or other oral mucosa - nerve damage due to positioning  - sore throat or hoarseness - Damage to heart, brain, nerves, lungs, other parts of body or loss of life  Patient voiced understanding and assent.)        Anesthesia Quick Evaluation  "

## 2024-06-09 NOTE — Op Note (Signed)
 Patient Name: Lisa Bryan DOB: [birth year]  MRN: [id number]   Date of Service: 06/09/2024  Surgeon: Dr. Prentice Ovens, DPM Assistants: Dr. Ethan Saddler, DPM Pre-operative Diagnosis:  osteoporosis with right displaced closed trimalleolar ankle fracture Post-operative Diagnosis:  osteoporosis with right displaced closed trimalleolar ankle fracture Procedures: Procedures:   * OPEN REDUCTION INTERNAL FIXATION (ORIF) ANKLE FRACTURE Pathology/Specimens: * No specimens in log * Anesthesia: General with pre-operative local block 20cc 1:1 mix 0.5%marcaine plain:1%lidocaine plain, post-operative block 19cc 0.25%marcaine, 1 cc dexamethasone phosphate Hemostasis:  Total Tourniquet Time Documented: Thigh (Right) - 104 minutes Total: Thigh (Right) - 104 minutes  Estimated Blood Loss: 10 mL Materials:  Implant Name Type Inv. Item Serial No. Manufacturer Lot No. LRB No. Used Action  WASHER ORTH 4 SCR - [id number] Miscellaneous WASHER ORTH 4 SCR  [employer]  Right 1 Implanted  SCREW 4.0 X 44 LT - [id number] Screw SCREW 4.0 X 44 LT  [employer]  Right 1 Implanted  SCREW CANN HEAD LT 4.0X42 - [id number] Screw SCREW CANN HEAD LT 4.0X42  [employer]  Right 1 Implanted  PLATE POST FIB AG 9H - [id number] Plate PLATE POST FIB AG 9H  [employer]  Right 1 Implanted  SCREW NON LOCKING 3.5X14 - [id number] Screw SCREW NON LOCKING 3.5X14  [employer]  Right 1 Implanted  SCREW LOCK 3 3.5X18 - [id number] Screw SCREW LOCK 3 3.5X18  [employer]  Right 1 Implanted  SCREW LOCK PLATE R3 6.4K87 - [id number] Screw SCREW LOCK PLATE R3 6.4K87  [employer]  Right 1 Implanted  SCREW LOCK PLATE R3 6.4K85 - [id number] Screw SCREW LOCK PLATE R3 6.4K85  [employer]  Right 2 Implanted  SCREW NON LOCKING 3.5X12 - [id number] Screw SCREW NON LOCKING 3.5X12  [employer]  Right 1 Implanted  SCREW 4.0 X 40 LT - [id number] Screw SCREW 4.0 X 40 LT  [employer]  Right 2 Implanted   Medications: 1g TXA prior to  tourniquet inflation, local blocks described above Complications: None  Indications for Procedure:  This is a 64 y.o. female with an unstable low energy ankle fracture of the Right lower extremity.  The injury occurred on 06/09/24.  She presented to the ED which attempted reduction and splinting of the fracture. The fracture was unable to be held in a reduced position.  This is a closed fracture but the patient did have a partial thickness skin abrasion to the skin just proximal to the medial malleolar fracture. There were no signs of infection.The patient and I had extensive discussion about different treatment options for this fracture.  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  The patient does express understanding of this and wishes to proceed with surgical intervention.  Imaging: Radiographic and CT imaging demonstrated a trimalleolar ankle fracture with a large posterior malleolar fracture piece with extension into the tibial plafond, a lateral fracture and a medial malleolus fracture. A fracture at the base of the second metatarsal is noted without displacement. The decision was made to perform ankle ORIF and allow for secondary healing at metatarsal base during post operative NWB period.    Procedure in Detail: Patient was identified in pre-operative holding area. Formal consent was signed and the right lower extremity was marked. The anesthesia service discussed a popliteal/saphenous nerve block which she elected to proceed without. Patient was brought back  to the operating room and placed on the table in the prone position. Anesthesia was induced. Pre-operative local block as described above was then infiltrated proximal to the surgical sites. 1g TXA was given IV. The extremity was prepped and draped in the usual sterile fashion. Timeout was taken to confirm patient name, laterality,  and procedure prior to incision.   Attention was then directed to the Right lower extremity.  There was a partial-thickness skin abrasion that was noted to be present medially just proximal to the medial malleolus fracture.  This did not extend deep and is not consistent with an open injury.  The patient had moderate swelling and ecchymosis formation.  Procedure 1. Closed, displaced trimalleolar ankle ORIF with fixation of posterior malleolus Attention was directed to the posterior lateral aspect of the ankle a 9 cm linear incision was made intermediate to the lateral aspect of the Achilles tendon and the posterior aspect of the fibula.  A commendation of blunt and sharp dissection was performed to deepen the incision down to the level of the peroneal fascia.  The sural nerve and small saphenous vein were identified and safely retracted. All other vital neurovascular structures were identified and retracted. All bleeders were ligated via electrocautery.  The peroneal fascia was incised and peroneals retracted laterally.  The incision was deepened further and the flexor hallucis longus fascia was incised and muscle belly retracted medially.  This exposed the posterior aspect of the distal tibia.  A linear periosteal incision was performed and minimal subperiosteal dissection performed to adequately expose the fracture site.  The fracture site was debrided of hematoma and impinged fracture fragments utilizing curette and irrigation.  The fracture was then reduced manually into anatomic position and temporarily fixated utilizing K wires.  Fluoroscopic imaging demonstrated good reduction of deformity.  Two 4.0 mm [employer] 28 partially-threaded cannulated screws were then inserted utilizing standard AO technique from posterior to anterior.  The inferior screw had a washer applied.  These had excellent purchase and good compression of fracture site.  Fluoroscopic imaging showed good placement and reduction of  fracture. Attention was then directed to the posterior aspect of the fibula.  A linear periosteal incision was performed to expose the fracture site.  The fracture was debrided of all hematoma utilizing curette/irrigation and reduced into anatomic position utilizing a point-to-point clamp.  It was temporarily fixated utilizing K wires.  Fluoroscopic imaging was taken that showed good reduction of deformity.  A [employer] 28 plate was then applied posteriorly in antiglide fashion.  It was fixated utilizing a combination of locking and nonlocking screws.  Excellent stability of the fracture was noted.  Fluoroscopic imaging showed good placement of hardware. Attention was directed to the medial malleolus.  A 4 cm linear incision was made overlying the medial malleolus fracture.  A combination of blunt and sharp dissection was utilized to deepen the incision down to the level of the periosteum.  All bleeders were ligated via electrocautery.  All vital neurovascular structures were identified and safely retracted.  The fracture fragment was immediately visible with the injury causing rubbing of the periosteum.  The fracture site was debrided of hematoma utilizing curette and irrigation.  A 2.0 mm drill hole was then made in the medial tibia 2 cm proximal to the fracture.  A point-to-point clamp was placed into the drill hole and around the distal aspect of the medial fragment piece.  It was then reduced into anatomic position and temporarily fixated with K wires.  Fluoroscopic  imaging showed good reduction of deformity.  Two 4.0 mm [employer] 28 partially-threaded cannulated screws were then inserted from the distal aspect of the medial malleolus into the distal tibia.  These had excellent purchase and good compression of fracture site.  Fluoroscopic imaging showed good placement and reduction of fracture. At this time wide fluoroscopic imaging was utilized to stress the syndesmosis by externally rotating the foot relative  to the leg.  No instability of the syndesmosis was noted following fixation of posterior malleolus.  Final fluoroscopic images were taken.  At this time, both incisions were irrigated with copious amounts of normal sterile saline.  They were then closed in layered fashion utilizing 2-0 Vicryl for deep tissue reapproximation, 3-0 Vicryl for subcutaneous reapproximation.  The tourniquet was then released at 104 minutes with immediate hyperemic response to all digits.  A combination of 3-0 nylon and skin staples for skin closure.  The postoperative block that was described above was then infiltrated into the surgical sites and just proximal.  The foot was then dressed with Xeroform, 4 x 4, Kerlix, cast padding, Ace.  A well-padded posterior fiberglass splint with the ankle held at 90 was then applied. Patient tolerated the procedure well.  The assistance of Dr. Lamount was necessary for completion of this case in an efficient manner.  He provided essential neurovascular retraction, assistance with dissection and closure.  Disposition: Following a period of post-operative monitoring, patient will be transferred to observation for pain control overnight.  The patient did very well with surgery.  I discussed with her and her husband that she is to be strictly nonweightbearing for approximately 6 weeks postoperative.  She has a history of vitamin D deficiency, current vitamin D level is pending.  She will be discharged on aspirin 81 mg twice daily for DVT prophylaxis and oxycodone /acetaminophen  5/325 every 6 hours as needed for pain control.  I will see her in the office in 1 week for follow-up.

## 2024-06-10 DIAGNOSIS — K219 Gastro-esophageal reflux disease without esophagitis: Secondary | ICD-10-CM | POA: Diagnosis not present

## 2024-06-10 DIAGNOSIS — S82851S Displaced trimalleolar fracture of right lower leg, sequela: Secondary | ICD-10-CM

## 2024-06-10 DIAGNOSIS — I10 Essential (primary) hypertension: Secondary | ICD-10-CM | POA: Diagnosis not present

## 2024-06-10 MED ORDER — TRAMADOL HCL 50 MG PO TABS: 50.0000 mg | ORAL_TABLET | Freq: Four times a day (QID) | ORAL | 0 refills | Status: AC | PRN

## 2024-06-10 MED ORDER — ASPIRIN 81 MG PO TBEC: 81.0000 mg | DELAYED_RELEASE_TABLET | Freq: Two times a day (BID) | ORAL | Status: AC

## 2024-06-10 MED ORDER — CEPHALEXIN 500 MG PO CAPS: 500.0000 mg | ORAL_CAPSULE | Freq: Three times a day (TID) | ORAL | 0 refills | Status: AC

## 2024-06-10 MED ORDER — ACETAMINOPHEN 325 MG PO TABS: 650.0000 mg | ORAL_TABLET | Freq: Four times a day (QID) | ORAL | Status: AC | PRN

## 2024-06-10 NOTE — Anesthesia Postprocedure Evaluation (Signed)
"   Anesthesia Post Note  Patient: Lisa Bryan  Procedure(s) Performed: OPEN REDUCTION INTERNAL FIXATION (ORIF) ANKLE FRACTURE (Right: Ankle)  Patient location during evaluation: PACU Anesthesia Type: General Level of consciousness: awake and alert Pain management: pain level controlled Vital Signs Assessment: post-procedure vital signs reviewed and stable Respiratory status: spontaneous breathing, nonlabored ventilation, respiratory function stable and patient connected to nasal cannula oxygen Cardiovascular status: blood pressure returned to baseline and stable Postop Assessment: no apparent nausea or vomiting Anesthetic complications: no   No notable events documented.   Last Vitals:  Vitals:   06/09/24 1946 06/10/24 0321  BP: 138/67 (!) 145/67  Pulse: 80 86  Resp: 16 15  Temp: 37.1 C 36.9 C  SpO2: 92% 93%    Last Pain:  Vitals:   06/09/24 2248  TempSrc:   PainSc: 2                  Debby Mines      "

## 2024-06-10 NOTE — Evaluation (Signed)
 Occupational Therapy Evaluation Patient Details Name: Lisa Bryan MRN: [id number] DOB: 09-Mar-1961 Today's Date: 06/10/2024   History of Present Illness   Pt is a 64 y.o. year old female presenting to [facility] following right ankle injury in which she sustained closed trimalleolar fracture after twisting her ankle and falling now s/p ORIF to be NWB. PMH of GERD, hypertension, osteoporosis.     Clinical Impressions Pt was seen for OT evaluation this date. PTA, she resides at home with her spouse and is fully independent with all tasks. Pt presents with mild deficits in balance and pain limiting their ability to perform ADL management at baseline level. Pt currently requires MOD I for bed mobility and supervision for STS, ambulation with RW and ADLs during session as described below. Edu on proper technique to maintain NWB status of RLE during ADL performance and transfers with pt demo good carryover. Edu on elevation and use of ice to manage edema. Handoff to PT at end of session. Will follow acutely to maximize safe return home.       If plan is discharge home, recommend the following:   A little help with bathing/dressing/bathroom;Help with stairs or ramp for entrance;A little help with walking and/or transfers     Functional Status Assessment   Patient has had a recent decline in their functional status and demonstrates the ability to make significant improvements in function in a reasonable and predictable amount of time.     Equipment Recommendations   None recommended by OT (has needed DME)     Recommendations for Other Services         Precautions/Restrictions   Precautions Precautions: Fall Recall of Precautions/Restrictions: Intact Required Braces or Orthoses: Splint/Cast Splint/Cast - Date Prophylactic Dressing Applied (if applicable): 06/09/24 Restrictions Weight Bearing Restrictions Per Provider Order: Yes RLE Weight Bearing Per Provider Order: Non weight  bearing     Mobility Bed Mobility Overal bed mobility: Independent                  Transfers Overall transfer level: Needs assistance Equipment used: Rolling walker (2 wheels) Transfers: Sit to/from Stand Sit to Stand: Supervision, Modified independent (Device/Increase time)           General transfer comment: handoff to PT for use of knee scooter      Balance Overall balance assessment: Needs assistance Sitting-balance support: Feet supported Sitting balance-Leahy Scale: Normal     Standing balance support: Bilateral upper extremity supported, During functional activity, Reliant on assistive device for balance Standing balance-Leahy Scale: Good                             ADL either performed or assessed with clinical judgement   ADL Overall ADL's : Needs assistance/impaired     Grooming: Wash/dry hands;Oral care;Standing;Supervision/safety               Lower Body Dressing: Supervision/safety;Sitting/lateral leans;Sit to/from stand Lower Body Dressing Details (indicate cue type and reason): donn underwear in standing at RW with unilateral support Toilet Transfer: Supervision/safety;Comfort height toilet;Grab bars   Toileting- Clothing Manipulation and Hygiene: Supervision/safety;Modified independent;Sit to/from stand;Sitting/lateral lean       Functional mobility during ADLs: Supervision/safety;Rolling walker (2 wheels);Modified independent       Vision         Perception         Praxis         Pertinent Vitals/Pain Pain Assessment Pain Assessment:  No/denies pain     Extremity/Trunk Assessment Upper Extremity Assessment Upper Extremity Assessment: Defer to OT evaluation   Lower Extremity Assessment Lower Extremity Assessment: RLE deficits/detail RLE Deficits / Details: s/p ORIF NWB RLE Coordination: decreased gross motor   Cervical / Trunk Assessment Cervical / Trunk Assessment: Normal   Communication  Communication Communication: No apparent difficulties   Cognition Arousal: Alert Behavior During Therapy: WFL for tasks assessed/performed Cognition: No apparent impairments                               Following commands: Intact       Cueing  General Comments          Exercises Other Exercises Other Exercises: Edu on role of OT in acute setting and DME recommendations for safe DC home while maintaining NWB on RLE.   Shoulder Instructions      Home Living Family/patient expects to be discharged to:: Private residence Living Arrangements: Spouse/significant other Available Help at Discharge: Family;Available PRN/intermittently Type of Home: House Home Access: Stairs to enter [employer] of Steps: 4 with bil HR but too far apart to reach at same time Entrance Stairs-Rails: Right;Left Home Layout: Two level;Able to live on main level with bedroom/bathroom     Bathroom Shower/Tub: Producer, Television/film/video: Handicapped height     Home Equipment: Grab bars - toilet   Additional Comments: knee scooter; reports she can get her old ramp back that she recently sold if needed      Prior Functioning/Environment Prior Level of Function : Independent/Modified Independent;Driving             Mobility Comments: independent at baseline ADLs Comments: reports she can go next door to her MILs house to shower since her bathroom is fully handicapped accessible    OT Problem List: Impaired balance (sitting and/or standing)   OT Treatment/Interventions: Self-care/ADL training;Patient/family education;Therapeutic exercise;Balance training;Therapeutic activities;DME and/or AE instruction;Energy conservation      OT Goals(Current goals can be found in the care plan section)   Acute Rehab OT Goals Patient Stated Goal: go home OT Goal Formulation: With patient Time For Goal Achievement: 06/24/24 Potential to Achieve Goals: Good ADL Goals Pt  Will Perform Lower Body Dressing: with modified independence;sitting/lateral leans;sit to/from stand;Independently Pt Will Transfer to Toilet: with modified independence;ambulating Additional ADL Goal #1: Pt will demo falls prevention strategies and NWB maintanence during ADL performance 3/3 trials to maximize safety and promote healing upon DC home.   OT Frequency:  Min 2X/week    Co-evaluation              AM-PAC OT 6 Clicks Daily Activity     Outcome Measure Help from another person eating meals?: None Help from another person taking care of personal grooming?: None Help from another person toileting, which includes using toliet, bedpan, or urinal?: A Little Help from another person bathing (including washing, rinsing, drying)?: A Little Help from another person to put on and taking off regular upper body clothing?: None Help from another person to put on and taking off regular lower body clothing?: A Little 6 Click Score: 21   End of Session Equipment Utilized During Treatment: Rolling walker (2 wheels) Nurse Communication: Mobility status  Activity Tolerance: Patient tolerated treatment well Patient left:  (handoff to PT)  OT Visit Diagnosis: Other abnormalities of gait and mobility (R26.89)  Time: 9154-9092 OT Time Calculation (min): 22 min Charges:  OT General Charges $OT Visit: 1 Visit OT Evaluation $OT Eval Moderate Complexity: 1 Mod Dionisio Aragones Chrismon, OTR/L  06/10/2024, 10:25 AM  Jimel Myler E Chrismon 06/10/2024, 10:23 AM

## 2024-06-10 NOTE — Assessment & Plan Note (Signed)
 On vitamin D

## 2024-06-10 NOTE — Assessment & Plan Note (Signed)
"   Continue PPI  "

## 2024-06-10 NOTE — Evaluation (Signed)
 Physical Therapy Evaluation Patient Details Name: Lisa Bryan MRN: [id number] DOB: 05/01/1961 Today's Date: 06/10/2024  History of Present Illness  Lisa Bryan is a 64 y.o. year old female presenting to [facility] following right ankle injury in which she sustained closed trimalleolar fracture after twisting her ankle and falling. Past medical history of GERD, hypertension, osteoporosis.podiatry was consulted for further management as previously noted initial consult note.  She underwent ORIF of right ankle this morning with Dr. Prentice Ovens as primary surgeon.   Clinical Impression  Patient received in bathroom with OT present. She is able to transfer from RW to knee scooter with cga and cues. Patient then ambulated 125 feet on knee scooter with cga. After seated rest she was able to hop up/down 4 steps with +2 cga. Patient demonstrates safe mobility with knee scooter and walker ( with OT). She will continue to benefit from skilled PT while here to improve independence.            If plan is discharge home, recommend the following: A little help with walking and/or transfers;A little help with bathing/dressing/bathroom;Assist for transportation;Help with stairs or ramp for entrance   Can travel by private vehicle    yes    Equipment Recommendations Rolling walker (2 wheels)  Recommendations for Other Services       Functional Status Assessment Patient has had a recent decline in their functional status and demonstrates the ability to make significant improvements in function in a reasonable and predictable amount of time.     Precautions / Restrictions Precautions Precautions: Fall Recall of Precautions/Restrictions: Intact Required Braces or Orthoses: Splint/Cast Splint/Cast - Date Prophylactic Dressing Applied (if applicable): 06/09/24 Restrictions Weight Bearing Restrictions Per Provider Order: Yes RLE Weight Bearing Per Provider Order: Non weight  bearing      Mobility  Bed Mobility Overal bed mobility: Independent                  Transfers Overall transfer level: Modified independent Equipment used: None                    Ambulation/Gait Ambulation/Gait assistance: Contact guard assist Gait Distance (Feet): 125 Feet Assistive device: Knee scooter Gait Pattern/deviations: Step-to pattern Gait velocity: decr     General Gait Details: patient is safe on knee scooter, limited by fatigue  Stairs Stairs: Yes Stairs assistance: Contact guard assist, +2 safety/equipment Stair Management: Two rails, Step to pattern, Forwards Number of Stairs: 4 General stair comments: increased effort needed to hop up steps but able to perform with +2 cga for safety  Wheelchair Mobility     Tilt Bed    Modified Rankin (Stroke Patients Only)       Balance Overall balance assessment: Needs assistance Sitting-balance support: Feet supported Sitting balance-Leahy Scale: Normal     Standing balance support: Bilateral upper extremity supported, During functional activity, Reliant on assistive device for balance Standing balance-Leahy Scale: Good                               Pertinent Vitals/Pain Pain Assessment Pain Assessment: Faces Faces Pain Scale: Hurts a little bit Pain Location: R ankle Pain Descriptors / Indicators: Grimacing, Guarding Pain Intervention(s): Monitored during session, Repositioned    Home Living Family/patient expects to be discharged to:: Private residence Living Arrangements: Spouse/significant other Available Help at Discharge: Family;Available PRN/intermittently Type of Home: House Home Access: Stairs to  enter Entrance Stairs-Rails: Right;Left Entrance Stairs-Number of Steps: 4 with bil HR but too far apart to reach at same time   Home Layout: Two level;Able to live on main level with bedroom/bathroom Home Equipment: Grab bars - toilet Additional Comments: knee  scooter; reports she can get her old ramp back that she recently sold if needed    Prior Function Prior Level of Function : Independent/Modified Independent;Driving             Mobility Comments: independent at baseline ADLs Comments: reports she can go next door to her MILs house to shower since her bathroom is fully handicapped accessible     Extremity/Trunk Assessment   Upper Extremity Assessment Upper Extremity Assessment: Defer to OT evaluation    Lower Extremity Assessment Lower Extremity Assessment: RLE deficits/detail RLE Deficits / Details: s/p ORIF NWB RLE Coordination: decreased gross motor    Cervical / Trunk Assessment Cervical / Trunk Assessment: Normal  Communication   Communication Communication: No apparent difficulties    Cognition Arousal: Alert Behavior During Therapy: WFL for tasks assessed/performed   PT - Cognitive impairments: No apparent impairments                         Following commands: Intact       Cueing Cueing Techniques: Verbal cues     General Comments      Exercises     Assessment/Plan    PT Assessment Patient needs continued PT services  PT Problem List Decreased strength;Decreased activity tolerance;Decreased balance;Decreased mobility;Decreased knowledge of use of DME;Decreased safety awareness;Pain       PT Treatment Interventions DME instruction;Gait training;Stair training;Functional mobility training;Therapeutic activities;Therapeutic exercise;Balance training;Patient/family education;Wheelchair mobility training    PT Goals (Current goals can be found in the Care Plan section)  Acute Rehab PT Goals Patient Stated Goal: return home PT Goal Formulation: With patient Time For Goal Achievement: 06/17/24 Potential to Achieve Goals: Good    Frequency Min 5X/week     Co-evaluation               AM-PAC PT 6 Clicks Mobility  Outcome Measure Help needed turning from your back to your side  while in a flat bed without using bedrails?: None Help needed moving from lying on your back to sitting on the side of a flat bed without using bedrails?: None Help needed moving to and from a bed to a chair (including a wheelchair)?: None Help needed standing up from a chair using your arms (e.g., wheelchair or bedside chair)?: A Little Help needed to walk in hospital room?: A Little Help needed climbing 3-5 steps with a railing? : A Lot 6 Click Score: 20    End of Session Equipment Utilized During Treatment: Gait belt Activity Tolerance: Patient tolerated treatment well Patient left: in chair;with call bell/phone within reach Nurse Communication: Mobility status PT Visit Diagnosis: Other abnormalities of gait and mobility (R26.89);Muscle weakness (generalized) (M62.81);Difficulty in walking, not elsewhere classified (R26.2);Unsteadiness on feet (R26.81);History of falling (Z91.81);Pain Pain - Right/Left: Right Pain - part of body: Ankle and joints of foot    Time: 9099-9073 PT Time Calculation (min) (ACUTE ONLY): 26 min   Charges:   PT Evaluation $PT Eval Moderate Complexity: 1 Mod PT Treatments $Gait Training: 8-22 mins PT General Charges $$ ACUTE PT VISIT: 1 Visit        Fryda Molenda, PT, GCS 06/10/2024,9:39 AM

## 2024-06-10 NOTE — Assessment & Plan Note (Signed)
 Continue blood pressure medications

## 2024-06-10 NOTE — TOC CM/SW Note (Signed)
 Transition of Care [facility]) - Inpatient Brief Assessment   Patient Details  Name: Lisa Bryan MRN: [id number] Date of Birth: Aug 01, 1960  Transition of Care [facility]) CM/SW Contact:    Daved JONETTA Hamilton, RN Phone Number: 06/10/2024, 10:00 AM   Clinical Narrative:   Transition of Care (TOC) Screening Note  Patient Details  Name: Lisa Bryan Date of Birth: 01/30/1961   Transition of Care [facility]) CM/SW Contact:    Daved JONETTA Hamilton, RN Phone Number: 06/10/2024, 10:00 AM  Patient's spouse will obtain RW outside hospital.   Transition of Care [facility]) has reviewed patient and no TOC needs have been identified at this time. If new patient transition needs arise, please place a TOC consult.     Transition of Care Asessment: Insurance and Status: Insurance coverage has been reviewed Patient has primary care physician: Yes   Prior level of function:: Independent Prior/Current Home Services: No current home services Social Drivers of Health Review: SDOH reviewed no interventions necessary Readmission risk has been reviewed: No (patient is under observation status, no score generated) Transition of care needs: no transition of care needs at this time

## 2024-06-10 NOTE — Assessment & Plan Note (Addendum)
 Dr. Magdalen and Dr. Lamount took to the operating room for right displaced closed trimalleolar ankle fracture.  Patient had an open reduction internal fixation.  Nonweightbearing right foot for 6 weeks.  Aspirin for DVT prophylaxis.  Did well with physical therapy.  Tramadol for pain.

## 2024-06-10 NOTE — Hospital Course (Signed)
 64 year old with a history of GERD with erosive esophagitis, HTN, and osteoporosis who presented to the ER early AM 06/09/2024 after suffering a mechanical fall with immediate pain in her right ankle.  Imaging in the ER revealed a trimalleolar fracture of the ankle with displacement in multiple planes.  Podiatry was consulted and the patient was taken directly to the OR for ORIF of her right ankle fracture.  TRH was consulted to attend to the patient's medical issues during the course of her hospital stay.   At the present time the patient is afebrile with stable vitals.  There was reportedly a self-limited episode of emesis at some point at the end of her procedure, or possibly in recovery, but her ET tube was still in place at the time and she was able to be suctioned. She appears to be medically stable at present with no respiratory distress whatsoever.  I have reviewed her lab work in detail.  Electrolytes are balanced and renal function is normal.  Hemoglobin is normal with normal platelet count and normal WBC.   06/10/24.  Patient did well with physical therapy.  Cleared by podiatry to go home.  Patient uses tramadol for pain.  Aspirin for DVT prophylaxis.

## 2024-06-10 NOTE — Discharge Instructions (Signed)
 Foot & Ankle Surgery Patient Discharge Instructions:   Arrange to have an adult drive you home after surgery. If you had general anesthesia, it may take a day or more to fully recover. So, for at least the next 24 hours: Do not drive or use machinery or power tools; do not drink alcohol; and do not make any major decisions.   Bandages: Keep your dressings clean, dry, and intact. Do not remove or change. When bathing/showering, cover the bandage or cast with a plastic bag to keep it dry (still keep the area away from the water) and use a chair, do not stand. Don't remove your bandage until your doctor tells you to. If your bandage gets wet or dirty, check with your doctor. You can likely replace it with a clean, dry one.  Activity: Non weight bearing to right lower extremity Sit or lie down when possible. Put a pillow under your heel to raise your foot above the level of your heart. Place ice bag or pack behind knee on surgical side for 20 minutes every hour that you are awake for the first 24-48 hours. You can drive again when instructed by your doctor. Wear your surgical shoe at all times unless told otherwise by your health care provider. Use crutches or a cane as directed. Follow your doctor's instructions about putting weight on your foot.  Diet: Start with liquids and light foods (such as dry toast, bananas, and applesauce). As you feel up to it, slowly return to your normal diet. Drink at least six to eight glasses of water or other nonalcoholic fluids a day. To avoid nausea, eat before taking narcotic pain medications.  Medications: Take all medications as instructed. Take pain medications on time. Do not wait until the pain is bad before taking your medications. Avoid alcohol while on pain medications.  Call your doctor if: Continuous bleeding through dressings. If your dressings get wet. Fevers over 100.4 degrees Fahrenheit (38 degrees Celsius). Chest pains or difficulty  breathing.

## 2024-06-10 NOTE — Care Plan (Signed)
 IV removed Dc instructions given to patient, all questions answered, no concerns at this time Ice man packed with other belongings MD, DPM and CM cleared pt for DC.  No other needs at this time

## 2024-06-10 NOTE — Progress Notes (Signed)
" °  Progress Note   Patient: Lisa Bryan FMW:[id number] DOB: Oct 16, 1960 DOA: 06/09/2024     0 DOS: the patient was seen and examined on 06/10/2024   Brief hospital course: 64 year old with a history of GERD with erosive esophagitis, HTN, and osteoporosis who presented to the ER early AM 06/09/2024 after suffering a mechanical fall with immediate pain in her right ankle.  Imaging in the ER revealed a trimalleolar fracture of the ankle with displacement in multiple planes.  Podiatry was consulted and the patient was taken directly to the OR for ORIF of her right ankle fracture.  TRH was consulted to attend to the patient's medical issues during the course of her hospital stay.   At the present time the patient is afebrile with stable vitals.  There was reportedly a self-limited episode of emesis at some point at the end of her procedure, or possibly in recovery, but her ET tube was still in place at the time and she was able to be suctioned. She appears to be medically stable at present with no respiratory distress whatsoever.  I have reviewed her lab work in detail.  Electrolytes are balanced and renal function is normal.  Hemoglobin is normal with normal platelet count and normal WBC.   06/10/24.  Patient did well with physical therapy.  Cleared by podiatry to go home.  Patient uses tramadol for pain.  Aspirin for DVT prophylaxis.  Assessment and Plan: * Closed right trimalleolar fracture, sequela Dr. Magdalen and Dr. Lamount took to the operating room for right displaced closed trimalleolar ankle fracture.  Patient had an open reduction internal fixation.  Nonweightbearing right foot for 6 weeks.  Aspirin for DVT prophylaxis.  Did well with physical therapy.  Tramadol for pain.  Osteoporosis On vitamin D  GERD (gastroesophageal reflux disease) Continue PPI  Essential hypertension Continue blood pressure medications.        Subjective: Patient feels okay.  States she can take tramadol at  home.  Physical Exam: Vitals:   06/09/24 1946 06/10/24 0321 06/10/24 0733 06/10/24 0816  BP: 138/67 (!) 145/67  (!) 148/78  Pulse: 80 86  86  Resp: 16 15  18   Temp: 98.8 F (37.1 C) 98.4 F (36.9 C)  98 F (36.7 C)  TempSrc: Oral   Oral  SpO2: 92% 93% 98% 95%  Weight:      Height:       Physical Exam HENT:     Head: Normocephalic.  Eyes:     General: Lids are normal.     Conjunctiva/sclera: Conjunctivae normal.  Cardiovascular:     Rate and Rhythm: Normal rate and regular rhythm.     Heart sounds: Normal heart sounds, S1 normal and S2 normal.  Pulmonary:     Breath sounds: No decreased breath sounds, wheezing, rhonchi or rales.  Abdominal:     Palpations: Abdomen is soft.     Tenderness: There is no abdominal tenderness.  Musculoskeletal:     Comments: Right foot in splint  Skin:    General: Skin is warm.  Neurological:     Mental Status: She is alert and oriented to person, place, and time.     Data Reviewed: Vitamin D 64.5, creatinine 0.68, CBC normal range  Disposition: Status is: Observation Patient being discharged today by podiatry team.  Planned Discharge Destination: Home with Home Health    Time spent: 28 minutes  Author: Charlie Patterson, MD 06/10/2024 12:16 PM  For on call review [email or web address].  "

## 2024-06-16 ENCOUNTER — Ambulatory Visit (INDEPENDENT_AMBULATORY_CARE_PROVIDER_SITE_OTHER)

## 2024-06-16 ENCOUNTER — Ambulatory Visit

## 2024-06-16 DIAGNOSIS — S82891D Other fracture of right lower leg, subsequent encounter for closed fracture with routine healing: Secondary | ICD-10-CM | POA: Diagnosis not present

## 2024-06-16 NOTE — Progress Notes (Signed)
 "  Subjective:  Patient ID: Lisa Bryan, female    DOB: 17-Jul-1960,  MRN: [id number]  Chief Complaint  Patient presents with   Routine Post Op    POV#1 DOS 06/09/2024: S/p ankle fracture repair. She feels like she is doing fine     DOS: 06/09/24  Procedure: Right ankle ORIF  64 y.o. female returns for post-op check. She is doing well in her postoperative recovery.  She has been compliant with taking aspirin .  She has been nonweightbearing.  She has not required pain medication.  She denies any complications.  Review of Systems: Negative except as noted in the HPI. Denies N/V/F/Ch.  Past Medical History:  Diagnosis Date   GERD (gastroesophageal reflux disease)    Hypertension    Insomnia    Current Medications[1]  Tobacco Use History[2]  Allergies[3] Objective:  There were no vitals filed for this visit. There is no height or weight on file to calculate BMI. Constitutional Well developed. Well nourished.  Vascular Foot warm and well perfused. Capillary refill normal to all digits.  Calf supple, nontender, no erythema, negative Homans  Neurologic Normal speech. Oriented to person, place, and time. Epicritic sensation to light touch grossly present bilaterally.  Dermatologic Skin healing well without signs of infection. Skin edges well coapted without signs of infection. Mild edema around surgical site.  Sutures and staples intact.  Orthopedic: Mild tenderness to palpation noted about the surgical site within normal limits.   Radiographs: 3 views of the right ankle were taken today.  These show intact plate and screw fixation of ankle fracture.  On the views today, the medial malleolar screws do appear to be within the gutter of the medial malleolus, intraoperative imaging did show that these were in a good position and not in the gutter. Assessment:   1. Closed fracture of right ankle with routine healing, subsequent encounter    Plan:  Patient was evaluated and treated  and all questions answered. Doing well in post operative recovery.   S/p Right ankle surgery  -Progressing as expected post-operatively.  She is doing very well in her recovery.  We reinforced that she is to be nonweightbearing for approximately 6 weeks. -XR: As above -WB Status: Non weight bearing in well padded fiberglass splint re-applied today with the ankle held at 90. -Sutures/staples: intact, will be removed at next post-op visit. -Medications: None needed.  She did state that she was taking 1 baby aspirin  a day.  I discussed taking 2/day, 162 mg total. -Foot redressed. -XR at next visit: ankle  Return to clinic in 2 weeks for suture and staple removal  Prentice Ovens, D.P.M.     [1]  Current Outpatient Medications:    acetaminophen  (TYLENOL ) 325 MG tablet, Take 2 tablets (650 mg total) by mouth every 6 (six) hours as needed for mild pain (pain score 1-3)., Disp: , Rfl:    aspirin  EC 81 MG tablet, Take 1 tablet (81 mg total) by mouth 2 (two) times daily. Swallow whole., Disp: , Rfl:    olmesartan (BENICAR) 20 MG tablet, Take 20 mg by mouth daily., Disp: , Rfl:    omeprazole (PRILOSEC) 20 MG capsule, Take 20 mg by mouth daily., Disp: , Rfl:    pantoprazole  (PROTONIX ) 20 MG tablet, Take 20 mg by mouth 2 (two) times daily. (Patient not taking: Reported on 06/09/2024), Disp: , Rfl:    traMADol  (ULTRAM ) 50 MG tablet, Take 1 tablet by mouth daily as needed for moderate pain. , Disp: ,  Rfl:    traMADol  (ULTRAM ) 50 MG tablet, Take 1 tablet (50 mg total) by mouth every 6 (six) hours as needed., Disp: 20 tablet, Rfl: 0   verapamil  (CALAN -SR) 120 MG CR tablet, Take by mouth., Disp: , Rfl:    zolpidem  (AMBIEN ) 5 MG tablet, Take 2.5 mg by mouth at bedtime as needed for sleep., Disp: , Rfl:  [2]  Social History Tobacco Use  Smoking Status Never  Smokeless Tobacco Never  [3]  Allergies Allergen Reactions   Iodinated Contrast Media Nausea Only   Other Swelling   Tetracyclines & Related      swelling   "

## 2024-06-30 ENCOUNTER — Ambulatory Visit

## 2024-06-30 DIAGNOSIS — S82891D Other fracture of right lower leg, subsequent encounter for closed fracture with routine healing: Secondary | ICD-10-CM

## 2024-06-30 NOTE — Progress Notes (Signed)
 "  Subjective:  Patient ID: Lisa Bryan, female    DOB: November 23, 1960,  MRN: [id number]  Chief Complaint  Patient presents with   Routine Post Op    Post op    DOS: 06/09/24  Procedure: Right ankle ORIF  64 y.o. female returns for post-op check. She is doing well in her postoperative recovery.  She has been compliant with taking aspirin .  She has been nonweightbearing.  She has not required pain medication. She has been in posterior fiberglass splint.  She denies any complications.  Review of Systems: Negative except as noted in the HPI. Denies N/V/F/Ch.  Past Medical History:  Diagnosis Date   GERD (gastroesophageal reflux disease)    Hypertension    Insomnia    Current Medications[1]  Tobacco Use History[2]  Allergies[3] Objective:  There were no vitals filed for this visit. There is no height or weight on file to calculate BMI. Constitutional Well developed. Well nourished.  Vascular Foot warm and well perfused. Capillary refill normal to all digits.  Calf supple, nontender, no erythema, negative Homans  Neurologic Normal speech. Oriented to person, place, and time. Epicritic sensation to light touch grossly present bilaterally.  Dermatologic Skin healing well without signs of infection. Skin edges well coapted without signs of infection. Mild edema around surgical site.  Sutures and staples removed without incident. No dehiscence upon removal.   Orthopedic: Mild tenderness to palpation noted about the surgical site within normal limits. Patient able to move ankle with minimal pain.     Assessment:   1. Closed fracture of right ankle with routine healing, subsequent encounter     Plan:  Patient was evaluated and treated and all questions answered. Doing well in post operative recovery.   S/p Right ankle surgery  -Progressing as expected post-operatively.  3 weeks post operative. She is doing very well in her recovery.  She has not yet placed any weight onto her  extremity.  I discussed with her that starting in approximately 1 week she can slowly begin to place weight on her extremity.  She is to let pain be her guide and will decrease her activity if she does experience pain.  -XR: Not needed today -WB Status: May begin slow progression to WBAT in 1 week. NWB for time being in CAM walker.  -Sutures/staples: Removed without incident, no dehiscence. Steri strips applied.  -Medications: None needed -Foot redressed. -XR at next visit: ankle  Return to clinic in 3-4 weeks for repeat evaluation. We discussed that she may or may not be fully weight bearing at that time. It is okay if she is not yet fully weight bearing.   Prentice Ovens, D.P.M.     [1]  Current Outpatient Medications:    acetaminophen  (TYLENOL ) 325 MG tablet, Take 2 tablets (650 mg total) by mouth every 6 (six) hours as needed for mild pain (pain score 1-3)., Disp: , Rfl:    aspirin  EC 81 MG tablet, Take 1 tablet (81 mg total) by mouth 2 (two) times daily. Swallow whole., Disp: , Rfl:    olmesartan (BENICAR) 20 MG tablet, Take 20 mg by mouth daily., Disp: , Rfl:    omeprazole (PRILOSEC) 20 MG capsule, Take 20 mg by mouth daily., Disp: , Rfl:    pantoprazole  (PROTONIX ) 20 MG tablet, Take 20 mg by mouth 2 (two) times daily., Disp: , Rfl:    traMADol  (ULTRAM ) 50 MG tablet, Take 1 tablet by mouth daily as needed for moderate pain. , Disp: ,  Rfl:    traMADol  (ULTRAM ) 50 MG tablet, Take 1 tablet (50 mg total) by mouth every 6 (six) hours as needed., Disp: 20 tablet, Rfl: 0   verapamil  (CALAN -SR) 120 MG CR tablet, Take by mouth., Disp: , Rfl:    zolpidem  (AMBIEN ) 5 MG tablet, Take 2.5 mg by mouth at bedtime as needed for sleep., Disp: , Rfl:  [2]  Social History Tobacco Use  Smoking Status Never  Smokeless Tobacco Never  [3]  Allergies Allergen Reactions   Iodinated Contrast Media Nausea Only   Other Swelling   Tetracyclines & Related     swelling   "

## 2024-07-28 ENCOUNTER — Encounter
